# Patient Record
Sex: Female | Born: 1955 | Race: White | Hispanic: No | Smoking: Current every day smoker
Health system: Southern US, Community
[De-identification: ages and names within clinical notes are randomized; demographics above are authoritative.]

## PROBLEM LIST (undated history)

## (undated) DIAGNOSIS — F419 Anxiety disorder, unspecified: Secondary | ICD-10-CM

## (undated) DIAGNOSIS — I509 Heart failure, unspecified: Secondary | ICD-10-CM

## (undated) DIAGNOSIS — M797 Fibromyalgia: Secondary | ICD-10-CM

## (undated) DIAGNOSIS — F329 Major depressive disorder, single episode, unspecified: Secondary | ICD-10-CM

## (undated) DIAGNOSIS — F32A Depression, unspecified: Secondary | ICD-10-CM

## (undated) DIAGNOSIS — M5030 Other cervical disc degeneration, unspecified cervical region: Secondary | ICD-10-CM

## (undated) HISTORY — DX: Major depressive disorder, single episode, unspecified: F32.9

## (undated) HISTORY — DX: Other cervical disc degeneration, unspecified cervical region: M50.30

## (undated) HISTORY — DX: Heart failure, unspecified: I50.9

## (undated) HISTORY — DX: Depression, unspecified: F32.A

## (undated) HISTORY — DX: Anxiety disorder, unspecified: F41.9

---

## 2001-10-07 HISTORY — PX: OTHER SURGICAL HISTORY: SHX169

## 2006-03-05 ENCOUNTER — Ambulatory Visit: Payer: Self-pay | Admitting: Pain Medicine

## 2006-03-11 ENCOUNTER — Ambulatory Visit: Payer: Self-pay | Admitting: Pain Medicine

## 2006-03-30 ENCOUNTER — Ambulatory Visit: Payer: Self-pay | Admitting: Pain Medicine

## 2006-04-16 ENCOUNTER — Ambulatory Visit: Payer: Self-pay | Admitting: Pain Medicine

## 2011-03-20 ENCOUNTER — Ambulatory Visit: Payer: Self-pay | Admitting: Pain Medicine

## 2014-03-10 LAB — LIPID PANEL
Cholesterol: 179 mg/dL (ref 0–200)
HDL: 73 mg/dL — AB (ref 35–70)
LDL CALC: 92 mg/dL
Triglycerides: 74 mg/dL (ref 40–160)

## 2014-03-10 LAB — BASIC METABOLIC PANEL: Glucose: 88 mg/dL

## 2014-05-02 ENCOUNTER — Other Ambulatory Visit: Payer: Self-pay

## 2014-05-02 LAB — COMPREHENSIVE METABOLIC PANEL
AST: 16 U/L (ref 15–37)
Albumin: 3.5 g/dL (ref 3.4–5.0)
Alkaline Phosphatase: 94 U/L
Anion Gap: 4 — ABNORMAL LOW (ref 7–16)
BUN: 16 mg/dL (ref 7–18)
Bilirubin,Total: 0.4 mg/dL (ref 0.2–1.0)
CHLORIDE: 106 mmol/L (ref 98–107)
CREATININE: 0.82 mg/dL (ref 0.60–1.30)
Calcium, Total: 8.9 mg/dL (ref 8.5–10.1)
Co2: 32 mmol/L (ref 21–32)
EGFR (Non-African Amer.): >60
Glucose: 76 mg/dL (ref 65–99)
OSMOLALITY: 283 (ref 275–301)
Potassium: 3.7 mmol/L (ref 3.5–5.1)
SGPT (ALT): 18 U/L (ref 12–78)
Sodium: 142 mmol/L (ref 136–145)
Total Protein: 7 g/dL (ref 6.4–8.2)

## 2015-10-29 ENCOUNTER — Encounter: Payer: Self-pay | Admitting: Physician Assistant

## 2015-10-29 ENCOUNTER — Ambulatory Visit (INDEPENDENT_AMBULATORY_CARE_PROVIDER_SITE_OTHER): Payer: BLUE CROSS/BLUE SHIELD | Admitting: Physician Assistant

## 2015-10-29 VITALS — BP 130/80 | HR 86 | Temp 97.9°F | Resp 16 | Wt 190.2 lb

## 2015-10-29 DIAGNOSIS — F419 Anxiety disorder, unspecified: Secondary | ICD-10-CM | POA: Insufficient documentation

## 2015-10-29 DIAGNOSIS — Z6372 Alcoholism and drug addiction in family: Secondary | ICD-10-CM | POA: Insufficient documentation

## 2015-10-29 DIAGNOSIS — M797 Fibromyalgia: Secondary | ICD-10-CM | POA: Insufficient documentation

## 2015-10-29 DIAGNOSIS — F32A Depression, unspecified: Secondary | ICD-10-CM | POA: Insufficient documentation

## 2015-10-29 DIAGNOSIS — J40 Bronchitis, not specified as acute or chronic: Secondary | ICD-10-CM | POA: Diagnosis not present

## 2015-10-29 DIAGNOSIS — R634 Abnormal weight loss: Secondary | ICD-10-CM | POA: Insufficient documentation

## 2015-10-29 DIAGNOSIS — Z791 Long term (current) use of non-steroidal anti-inflammatories (NSAID): Secondary | ICD-10-CM | POA: Insufficient documentation

## 2015-10-29 DIAGNOSIS — R059 Cough, unspecified: Secondary | ICD-10-CM

## 2015-10-29 DIAGNOSIS — F329 Major depressive disorder, single episode, unspecified: Secondary | ICD-10-CM | POA: Insufficient documentation

## 2015-10-29 DIAGNOSIS — M419 Scoliosis, unspecified: Secondary | ICD-10-CM | POA: Insufficient documentation

## 2015-10-29 DIAGNOSIS — Z8719 Personal history of other diseases of the digestive system: Secondary | ICD-10-CM | POA: Insufficient documentation

## 2015-10-29 DIAGNOSIS — M5030 Other cervical disc degeneration, unspecified cervical region: Secondary | ICD-10-CM | POA: Insufficient documentation

## 2015-10-29 DIAGNOSIS — R05 Cough: Secondary | ICD-10-CM | POA: Diagnosis not present

## 2015-10-29 MED ORDER — PREDNISONE 10 MG (21) PO TBPK
ORAL_TABLET | ORAL | Status: DC
Start: 2015-10-29 — End: 2015-11-05

## 2015-10-29 MED ORDER — AZITHROMYCIN 250 MG PO TABS: ORAL_TABLET | ORAL | Status: DC

## 2015-10-29 NOTE — Progress Notes (Signed)
Patient: Maureen Grant Female    DOB: Aug 11, 1956   59 y.o.   MRN: [id number] Visit Date: 10/29/2015  Today's Provider: Margaretann Loveless, PA-C   Chief Complaint  Patient presents with  . URI   Subjective:    URI  This is a new problem. The current episode started 1 to 4 weeks ago. The problem has been gradually worsening. There has been no fever. Associated symptoms include coughing, nausea (none currently) and wheezing (only when lying). Pertinent negatives include no abdominal pain, chest pain, congestion, headaches, rhinorrhea, sinus pain, sneezing or vomiting. Associated symptoms comments: Just the chest tightness and congestion. She has tried nothing for the symptoms. The treatment provided no relief.  She states her symptoms started Saturday, November 19. She went to [facility] with some friends and noticed that she was walking around she was coughing and having difficulty breathing. On Sunday the 20th she developed nausea along with the cough that lasted through Monday. She did not go to work these 2 days. She states that she coughs with deep breathing and developed shortness of breath with any exertion even when speaking. She has difficulty lying flat stating that she can't breathe well when she lies directly on her back. She can lie on her side but does notice some wheezing. She does not notice any wheezing when she is standing or sitting. She denies any sneezing, rhinorrhea, fever or chills. She does have positive history of smoking for the last 42 years. She states she tried to quit in 2015 where she switched to a E-cigarettes. She states that she does keep a pack of her tenuous limbs with her at work so that she can take a break. She states that she smokes one pack of cigarettes every 2 weeks. She states she has not smoked any since developing the cough and shortness of breath.     No Known Allergies Previous Medications   MELOXICAM (MOBIC) 15 MG TABLET    Take by mouth.     METHOCARBAMOL (ROBAXIN) 750 MG TABLET    Take by mouth.   NAPROXEN SODIUM (ALEVE) 220 MG CAPS    Take by mouth.   VENLAFAXINE XR (EFFEXOR-XR) 150 MG 24 HR CAPSULE    Take 150 mg by mouth daily with breakfast.    Review of Systems  Constitutional: Negative for fever, chills and fatigue.  HENT: Negative for congestion, rhinorrhea, sinus pressure and sneezing.   Eyes: Negative.   Respiratory: Positive for cough, chest tightness, shortness of breath and wheezing (only when lying).   Cardiovascular: Negative for chest pain, palpitations and leg swelling.  Gastrointestinal: Positive for nausea (none currently). Negative for vomiting, abdominal pain and abdominal distention.  Genitourinary: Negative.   Musculoskeletal: Negative.   Neurological: Negative for dizziness, light-headedness and headaches.    Social History  Substance Use Topics  . Smoking status: Current Some Day Smoker  . Smokeless tobacco: Not on file  . Alcohol Use: No   Objective:   BP 130/80 mmHg  Pulse 86  Temp(Src) 97.9 F (36.6 C) (Oral)  Resp 16  Wt 190 lb 3.2 oz (86.274 kg)  SpO2 97%  Physical Exam  Constitutional: She appears well-developed and well-nourished. No distress.  HENT:  Head: Normocephalic and atraumatic.  Right Ear: Hearing, tympanic membrane, external ear and ear canal normal.  Left Ear: Hearing, tympanic membrane, external ear and ear canal normal.  Nose: Nose normal. Right sinus exhibits no maxillary sinus tenderness and no  frontal sinus tenderness. Left sinus exhibits no maxillary sinus tenderness and no frontal sinus tenderness.  Mouth/Throat: Uvula is midline, oropharynx is clear and moist and mucous membranes are normal. No oropharyngeal exudate.  Eyes: Conjunctivae are normal. Pupils are equal, round, and reactive to light. Right eye exhibits no discharge. Left eye exhibits no discharge. No scleral icterus.  Neck: Normal range of motion. Neck supple. No tracheal deviation present. No  thyromegaly present.  Cardiovascular: Normal rate, regular rhythm and normal heart sounds.  Exam reveals no gallop and no friction rub.   No murmur heard. Pulmonary/Chest: Effort normal. No accessory muscle usage or stridor. No respiratory distress. She has decreased breath sounds. She has no wheezes. She has no rhonchi. She has no rales.  Lymphadenopathy:    She has no cervical adenopathy.  Skin: Skin is warm and dry. She is not diaphoretic.  Vitals reviewed.       Assessment & Plan:     1. Bronchitis Worsening. There was no wheezing on exam today but she did have decreased breath sounds and cough with deep breathing. I will prescribe azithromycin as below and prednisone steroid taper. She is to call the office if symptoms fail to improve or worsen. May consider chest x-ray and spirometry due to her smoking history at that time if there is no improvement. - azithromycin (ZITHROMAX) 250 MG tablet; Take 2 tablets PO on day one, and one tablet PO daily thereafter until completed.  Dispense: 6 tablet; Refill: 0 - predniSONE (STERAPRED UNI-PAK 21 TAB) 10 MG (21) TBPK tablet; Take as directed on the package.  Dispense: 21 tablet; Refill: 0  2. Cough States her cough does not bother her that much at night if she is able to lie still and quiet. She does not wish to have a also present at this time. I did advise that she may try Delsym cough syrup or Robitussin for cough suppression if needed. She voiced understanding. Give a prednisone six-day taper as below for inflammation. She is to call the office if symptoms fail to improve or worsen. If there is no improvement may consider chest x-ray and possible spirometry to rule out other cause due to her smoking history. - predniSONE (STERAPRED UNI-PAK 21 TAB) 10 MG (21) TBPK tablet; Take as directed on the package.  Dispense: 21 tablet; Refill: 0       Margaretann LovelessJennifer M Ailyne Pawley, PA-C  [facility]

## 2015-10-29 NOTE — Patient Instructions (Signed)

## 2015-11-03 ENCOUNTER — Encounter: Payer: Self-pay | Admitting: Physician Assistant

## 2015-11-05 ENCOUNTER — Encounter: Payer: Self-pay | Admitting: Physician Assistant

## 2015-11-05 ENCOUNTER — Ambulatory Visit (INDEPENDENT_AMBULATORY_CARE_PROVIDER_SITE_OTHER): Payer: BLUE CROSS/BLUE SHIELD | Admitting: Physician Assistant

## 2015-11-05 VITALS — BP 124/80 | HR 86 | Temp 99.9°F | Resp 16 | Wt 193.6 lb

## 2015-11-05 DIAGNOSIS — J40 Bronchitis, not specified as acute or chronic: Secondary | ICD-10-CM

## 2015-11-05 DIAGNOSIS — R05 Cough: Secondary | ICD-10-CM

## 2015-11-05 DIAGNOSIS — R059 Cough, unspecified: Secondary | ICD-10-CM

## 2015-11-05 MED ORDER — LEVOFLOXACIN 500 MG PO TABS: 500.0000 mg | ORAL_TABLET | Freq: Every day | ORAL | Status: DC

## 2015-11-05 MED ORDER — PREDNISONE 10 MG (21) PO TBPK: ORAL_TABLET | ORAL | Status: DC

## 2015-11-05 NOTE — Patient Instructions (Signed)

## 2015-11-05 NOTE — Progress Notes (Signed)
Patient: Maureen Grant Female    DOB: 09-14-1956   59 y.o.   MRN: [id number] Visit Date: 11/05/2015  Today's Provider: Margaretann Loveless, PA-C   Chief Complaint  Patient presents with  . URI   Subjective:    URI  This is a recurrent problem. The current episode started 1 to 4 weeks ago. Associated symptoms include congestion (chest), coughing (still the same) and wheezing. Pertinent negatives include no ear pain, sneezing or sore throat. She has tried decongestant (was taking azithromycin) for the symptoms.   she was prescribed azithromycin and a prednisone 6 day taper. She states she has not had much improvement with the treatment. She states she continues to have increasing cough and wheezing when she lies down flat. She states she has also had some increased shortness of breath with exertion and fatigue. She has not measured her temperature to see if she has had a fever. Her cough is been mostly dry with occasional mucus production.     No Known Allergies Previous Medications   AZITHROMYCIN (ZITHROMAX) 250 MG TABLET    Take 2 tablets PO on day one, and one tablet PO daily thereafter until completed.   MELOXICAM (MOBIC) 15 MG TABLET    Take by mouth.   METHOCARBAMOL (ROBAXIN) 750 MG TABLET    Take by mouth.   NAPROXEN SODIUM (ALEVE) 220 MG CAPS    Take by mouth.   PREDNISONE (STERAPRED UNI-PAK 21 TAB) 10 MG (21) TBPK TABLET    Take as directed on the package.   VENLAFAXINE XR (EFFEXOR-XR) 150 MG 24 HR CAPSULE    Take 150 mg by mouth daily with breakfast.    Review of Systems  Constitutional: Positive for fatigue. Negative for fever and chills.  HENT: Positive for congestion (chest), postnasal drip and sinus pressure. Negative for ear pain, hearing loss, nosebleeds, sneezing, sore throat and trouble swallowing.   Eyes: Negative.   Respiratory: Positive for cough (still the same), chest tightness, shortness of breath and wheezing.   Cardiovascular: Negative.     Gastrointestinal: Negative.     Social History  Substance Use Topics  . Smoking status: Current Some Day Smoker  . Smokeless tobacco: Not on file  . Alcohol Use: No   Objective:   BP 124/80 mmHg  Pulse 86  Temp(Src) 99.9 F (37.7 C) (Oral)  Resp 16  Wt 193 lb 9.6 oz (87.816 kg)  Physical Exam  Constitutional: She appears well-developed and well-nourished. No distress.  HENT:  Head: Normocephalic and atraumatic.  Right Ear: Hearing, tympanic membrane, external ear and ear canal normal.  Left Ear: Hearing, tympanic membrane, external ear and ear canal normal.  Nose: Nose normal.  Mouth/Throat: Uvula is midline, oropharynx is clear and moist and mucous membranes are normal. No oropharyngeal exudate.  Eyes: Conjunctivae are normal. Pupils are equal, round, and reactive to light. Right eye exhibits no discharge. Left eye exhibits no discharge. No scleral icterus.  Neck: Normal range of motion. Neck supple. No tracheal deviation present. No thyromegaly present.  Cardiovascular: Normal rate, regular rhythm and normal heart sounds.  Exam reveals no gallop and no friction rub.   No murmur heard. Pulmonary/Chest: Effort normal. No stridor. No respiratory distress. She has no wheezes. She has rhonchi in the left upper field. She has no rales.  Lymphadenopathy:    She has no cervical adenopathy.  Skin: Skin is warm and dry. She is not diaphoretic.  Vitals reviewed.  Assessment & Plan:     1. Bronchitis Not improving. We'll change antibiotic from azithromycin to Levaquin as below. I have also given another round of prednisone 6 day taper. I did advise that she may need to proper self up at night instead of lying flat to help with breathing. She states that over-the-counter cough suppressants are still working to allow her to sleep at night with her cough. She is to call the office if symptoms fail to improve or worsen as we may obtain a chest x-ray at that time. I will see her back  as needed or if symptoms do not improve. - levofloxacin (LEVAQUIN) 500 MG tablet; Take 1 tablet (500 mg total) by mouth daily.  Dispense: 10 tablet; Refill: 0 - predniSONE (STERAPRED UNI-PAK 21 TAB) 10 MG (21) TBPK tablet; Take as directed on the package.  Dispense: 21 tablet; Refill: 0  2. Cough See above medical treatment plan. - predniSONE (STERAPRED UNI-PAK 21 TAB) 10 MG (21) TBPK tablet; Take as directed on the package.  Dispense: 21 tablet; Refill: 0       Margaretann LovelessJennifer M Burnette, PA-C  [facility]

## 2015-11-23 ENCOUNTER — Telehealth: Payer: Self-pay

## 2015-11-23 DIAGNOSIS — J069 Acute upper respiratory infection, unspecified: Secondary | ICD-10-CM

## 2015-11-23 MED ORDER — AMOXICILLIN-POT CLAVULANATE 875-125 MG PO TABS: 1.0000 | ORAL_TABLET | Freq: Two times a day (BID) | ORAL | Status: DC

## 2015-11-23 NOTE — Telephone Encounter (Signed)
Patient reports that she was seen about 2 weeks ago and was treated for Bronchitis. She is wanting to know if she can have another round of abx. She reports that for the past 2 days, she has had a cough, wheezing, and shortness of breath. Denies fever. Patient is requesting that something be called into the pharmacy. Thanks!

## 2015-11-23 NOTE — Telephone Encounter (Signed)
Antibiotic sent to [facility]

## 2015-12-11 ENCOUNTER — Other Ambulatory Visit: Payer: Self-pay | Admitting: Specialist

## 2015-12-11 DIAGNOSIS — R0609 Other forms of dyspnea: Secondary | ICD-10-CM

## 2015-12-11 DIAGNOSIS — Z8709 Personal history of other diseases of the respiratory system: Secondary | ICD-10-CM

## 2015-12-11 DIAGNOSIS — F17200 Nicotine dependence, unspecified, uncomplicated: Secondary | ICD-10-CM

## 2015-12-18 ENCOUNTER — Encounter: Payer: Self-pay | Admitting: Physician Assistant

## 2015-12-18 ENCOUNTER — Ambulatory Visit (INDEPENDENT_AMBULATORY_CARE_PROVIDER_SITE_OTHER): Payer: BLUE CROSS/BLUE SHIELD | Admitting: Physician Assistant

## 2015-12-18 VITALS — BP 130/88 | HR 97 | Temp 98.3°F | Resp 16 | Wt 183.4 lb

## 2015-12-18 DIAGNOSIS — R0609 Other forms of dyspnea: Secondary | ICD-10-CM | POA: Diagnosis not present

## 2015-12-18 DIAGNOSIS — R197 Diarrhea, unspecified: Secondary | ICD-10-CM

## 2015-12-18 DIAGNOSIS — R059 Cough, unspecified: Secondary | ICD-10-CM

## 2015-12-18 DIAGNOSIS — J984 Other disorders of lung: Secondary | ICD-10-CM

## 2015-12-18 DIAGNOSIS — R221 Localized swelling, mass and lump, neck: Secondary | ICD-10-CM | POA: Diagnosis not present

## 2015-12-18 DIAGNOSIS — R05 Cough: Secondary | ICD-10-CM

## 2015-12-18 MED ORDER — HYDROCODONE-HOMATROPINE 5-1.5 MG/5ML PO SYRP: 5.0000 mL | ORAL_SOLUTION | Freq: Three times a day (TID) | ORAL | Status: DC | PRN

## 2015-12-18 MED ORDER — MOMETASONE FURO-FORMOTEROL FUM 200-5 MCG/ACT IN AERO: 2.0000 | INHALATION_SPRAY | Freq: Two times a day (BID) | RESPIRATORY_TRACT | Status: DC

## 2015-12-18 NOTE — Patient Instructions (Signed)
Formoterol; Mometasone metered dose inhaler What is this medicine? FORMOTEROL; MOMETASONE (for [place] te rol; moe MET a sone) inhalation is a combination of two medicines that decrease inflammation and help to open up the airways in your lungs. It is used to treat asthma. Do NOT use in an acute asthma attack. This medicine may be used for other purposes; ask your health care provider or pharmacist if you have questions. What should I tell my health care provider before I take this medicine? They need to know if you have any of these conditions: -adrenal tumor -aneurysm -bone problems -diabetes -glaucoma -heart disease or irregular heartbeat -high blood pressure -immune system problems -infection -seizures -thyroid problems -worsening asthma -an unusual or allergic reaction to formoterol, mometasone, other medicines, foods, dyes, or preservatives -pregnant or trying to get pregnant -breast-feeding How should I use this medicine? This medicine is inhaled through the mouth. Follow the directions on the prescription label. Rinse your mouth with water after use. Make sure not to swallow the water. Take your medicine at regular intervals. Do not take your medicine more often than directed. Do not stop taking except on your doctor's advice. Make sure that you are using your inhaler correctly. Ask your doctor or health care provider if you have any questions. A special MedGuide will be given to you by the pharmacist with each prescription and refill. Be sure to read this information carefully each time. Talk to your pediatrician regarding the use of this medicine in children. Special care may be needed. Overdosage: If you think you have taken too much of this medicine contact a poison control center or emergency room at once. NOTE: This medicine is only for you. Do not share this medicine with others. What if I miss a dose? If you miss a dose, use it as soon as you remember. If it is almost time for  your next dose, use only that dose and continue with your regular schedule, spacing doses evenly. Do not use double or extra doses. What may interact with this medicine? Do not take this medicine with any of the following mediations: -MAOIs like Carbex, Eldepryl, Marplan, Nardil, and Parnate This medicine may also interact with the following medications: -aminophylline or theophylline -antiviral medicines for HIV or AIDS -certain antibiotics like clarithromycin, linezolid, and telithromycin -certain medicines for blood pressure, heart disease, or irregular heart beat -certain medicines for colds -certain medicines for depression or emotional conditions -certain medicines for fungal infections like ketoconazole and itraconazole -diuretics -other medicines for breathing problems This list may not describe all possible interactions. Give your health care provider a list of all the medicines, herbs, non-prescription drugs, or dietary supplements you use. Also tell them if you smoke, drink alcohol, or use illegal drugs. Some items may interact with your medicine. What should I watch for while using this medicine? Visit your doctor for regular check ups. Tell your doctor or health care professional if your symptoms do not get better. If your symptoms get worse or if you need your short-acting inhalers more often, call your doctor right away. Do not use this medicine more than every 12 hours. If you have asthma, be aware that using this medicine may increase your risk of dying from asthma-related problems. Talk to your doctor about the risks and benefits of taking this medicine. NEVER use this medicine for an acute asthma attack. This medicine may increase your risk of getting an infection. Tell your doctor or health care professional if you are around  anyone with measles or chickenpox, or if you develop sores or blisters that do not heal properly. What side effects may I notice from receiving this  medicine? Side effects that you should report to your doctor or health care professional as soon as possible: -allergic reactions like skin rash or hives, swelling of the face, lips, or tongue -breathing problems -chest pain -dizziness or lightheaded -fever or chills -high blood pressure -irregular heartbeat -vision problems Side effects that usually do not require medical attention (Report these to your doctor or health care professional if they continue or are bothersome.): -coughing, hoarseness, throat irritation -different taste in mouth -headache -nervousness -stomach upset -stuffy nose -tremor This list may not describe all possible side effects. Call your doctor for medical advice about side effects. You may report side effects to FDA at 1-800-FDA-1088. Where should I keep my medicine? Keep out of the reach of children. Store at room temperature between 59 and 86 degrees F (15 and 30 degrees C). Throw away the inhaler after the dose counter reaches 0 or after the expiration date, whichever comes first. Avoid exposure to heat, fire, and flame. NOTE: This sheet is a summary. It may not cover all possible information. If you have questions about this medicine, talk to your doctor, pharmacist, or health care provider.    2016, Elsevier/Gold Standard. (2013-03-24 16:04:55) Diarrhea Diarrhea is frequent loose and watery bowel movements. It can cause you to feel weak and dehydrated. Dehydration can cause you to become tired and thirsty, have a dry mouth, and have decreased urination that often is dark yellow. Diarrhea is a sign of another problem, most often an infection that will not last long. In most cases, diarrhea typically lasts 2-3 days. However, it can last longer if it is a sign of something more serious. It is important to treat your diarrhea as directed by your caregiver to lessen or prevent future episodes of diarrhea. CAUSES  Some common causes include:  Gastrointestinal  infections caused by viruses, bacteria, or parasites.  Food poisoning or food allergies.  Certain medicines, such as antibiotics, chemotherapy, and laxatives.  Artificial sweeteners and fructose.  Digestive disorders. HOME CARE INSTRUCTIONS  Ensure adequate fluid intake (hydration): Have 1 cup (8 oz) of fluid for each diarrhea episode. Avoid fluids that contain simple sugars or sports drinks, fruit juices, whole milk products, and sodas. Your urine should be clear or pale yellow if you are drinking enough fluids. Hydrate with an oral rehydration solution that you can purchase at pharmacies, retail stores, and online. You can prepare an oral rehydration solution at home by mixing the following ingredients together:   - tsp table salt.   tsp baking soda.   tsp salt substitute containing potassium chloride.  1  tablespoons sugar.  1 L (34 oz) of water.  Certain foods and beverages may increase the speed at which food moves through the gastrointestinal (GI) tract. These foods and beverages should be avoided and include:  Caffeinated and alcoholic beverages.  High-fiber foods, such as raw fruits and vegetables, nuts, seeds, and whole grain breads and cereals.  Foods and beverages sweetened with sugar alcohols, such as xylitol, sorbitol, and mannitol.  Some foods may be well tolerated and may help thicken stool including:  Starchy foods, such as rice, toast, pasta, low-sugar cereal, oatmeal, grits, baked potatoes, crackers, and bagels.  Bananas.  Applesauce.  Add probiotic-rich foods to help increase healthy bacteria in the GI tract, such as yogurt and fermented milk products.  Wash  your hands well after each diarrhea episode.  Only take over-the-counter or prescription medicines as directed by your caregiver.  Take a warm bath to relieve any burning or pain from frequent diarrhea episodes. SEEK IMMEDIATE MEDICAL CARE IF:   You are unable to keep fluids down.  You have  persistent vomiting.  You have blood in your stool, or your stools are black and tarry.  You do not urinate in 6-8 hours, or there is only a small amount of very dark urine.  You have abdominal pain that increases or localizes.  You have weakness, dizziness, confusion, or light-headedness.  You have a severe headache.  Your diarrhea gets worse or does not get better.  You have a fever or persistent symptoms for more than 2-3 days.  You have a fever and your symptoms suddenly get worse. MAKE SURE YOU:   Understand these instructions.  Will watch your condition.  Will get help right away if you are not doing well or get worse.   This information is not intended to replace advice given to you by your health care provider. Make sure you discuss any questions you have with your health care provider.   Document Released: 11/07/2002 Document Revised: 12/08/2014 Document Reviewed: 07/25/2012 Elsevier Interactive Patient Education [employer].

## 2015-12-18 NOTE — Progress Notes (Signed)
Patient: Maureen Grant Female    DOB: [birth year]   60 y.o.   MRN: [id number] Visit Date: 12/18/2015  Today's Provider: Margaretann Loveless, PA-C   Chief Complaint  Patient presents with  . Follow-up    Bronchitis   Subjective:    HPI Bronchitis:Patient is here for follow-up on her bronchitis. Last office visit the antibiotic was changed from azithromycin to Levaquin and was also given another round of prednisone 6 day taper. Per patient she is not doing any better with the cough,SOB,wheezing and chest tightness.Patient went to [facility] went because her bronchitis was not any better. They obtained an Xray and got new medication Prednisone, Albuterol Inhaler, codeine-guaifenesin which patient completed the course.Patient was Referred to Pulmonary and patient was seen on the 10th of January and had a breathing test which showed a moderate to severe restrictive pattern.  Patient also got an Echo done yesterday for which we do not have the results for, and has an appointment for CT chest scan on the 19th of Jan at the [facility]. Per patient the neck pain and swelling is new, and has had vomiting and diarrhea for a week now. No melena or hematochezia. Loose, watery stools occurring approximately 2-3 times daily. Denies any tenesmus or abdominal pain/cramping.   Allergies  Allergen Reactions  . Latex Dermatitis   Previous Medications   MELOXICAM (MOBIC) 15 MG TABLET    Take by mouth.   METHOCARBAMOL (ROBAXIN) 750 MG TABLET    Take by mouth.   NAPROXEN SODIUM (ALEVE) 220 MG CAPS    Take by mouth.   PROVENTIL HFA 108 (90 BASE) MCG/ACT INHALER    INHALE 2 PUFFS PO Q 6 H PRN FOR WHEEZING   VENLAFAXINE XR (EFFEXOR-XR) 150 MG 24 HR CAPSULE    Take 150 mg by mouth daily with breakfast.    Review of Systems  Constitutional: Positive for chills and fatigue.  HENT: Positive for congestion and rhinorrhea. Negative for ear pain, sneezing, sore throat, tinnitus, trouble  swallowing and voice change.   Eyes: Negative.   Respiratory: Positive for cough, chest tightness, shortness of breath and wheezing.   Cardiovascular: Negative for chest pain, palpitations and leg swelling.  Gastrointestinal: Positive for vomiting (it has been a week) and diarrhea (it started a week ago). Negative for nausea, abdominal pain and constipation.  Endocrine: Negative.   Genitourinary: Negative.   Musculoskeletal: Positive for neck pain (right side hurts ans swollen).  Skin: Negative.   Allergic/Immunologic: Negative.   Neurological: Negative.   Hematological: Negative.   Psychiatric/Behavioral: Negative.   All other systems reviewed and are negative.   Social History  Substance Use Topics  . Smoking status: Current Some Day Smoker  . Smokeless tobacco: Not on file  . Alcohol Use: No   Objective:   BP 130/88 mmHg  Pulse 97  Temp(Src) 98.3 F (36.8 C) (Oral)  Resp 16  Wt 183 lb 6.4 oz (83.19 kg)  SpO2 96%  Physical Exam  Constitutional: She appears well-developed and well-nourished. No distress.  HENT:  Head: Normocephalic and atraumatic.  Right Ear: Hearing, tympanic membrane, external ear and ear canal normal.  Left Ear: Hearing, tympanic membrane, external ear and ear canal normal.  Nose: Nose normal. Right sinus exhibits no maxillary sinus tenderness and no frontal sinus tenderness. Left sinus exhibits no maxillary sinus tenderness and no frontal sinus tenderness.  Mouth/Throat: Uvula is midline, oropharynx is clear and moist and mucous membranes  are normal. No oropharyngeal exudate, posterior oropharyngeal edema or posterior oropharyngeal erythema.  Eyes: Conjunctivae are normal. Pupils are equal, round, and reactive to light. Right eye exhibits no discharge. Left eye exhibits no discharge. No scleral icterus.  Neck: Normal range of motion. Neck supple. No JVD present. Carotid bruit is not present. No rigidity. No tracheal deviation, no erythema and normal range  of motion present.    Cardiovascular: Normal rate, regular rhythm and normal heart sounds.  Exam reveals no gallop and no friction rub.   No murmur heard. Pulmonary/Chest: Effort normal and breath sounds normal. No stridor. No respiratory distress. She has no wheezes. She has no rales.  Abdominal: Soft. Bowel sounds are normal. She exhibits no distension and no mass. There is no tenderness. There is no rebound and no guarding.  Musculoskeletal: Normal range of motion. She exhibits no edema.  Lymphadenopathy:    She has no cervical adenopathy.  Skin: Skin is warm and dry. She is not diaphoretic.  Vitals reviewed.       Assessment & Plan:     1. Cough I will refill her Hycodan cough syrup for her nighttime cough. Cough had been managed with this medication. She is to follow-up with a chest CT on January 19 and then see Dr. Meredeth Ide, pulmonology, on January 23 to follow-up all of her testing. I will see her back in 2 weeks following her appointment with Dr. Meredeth Ide to further reevaluate how she is doing and to follow-up with the diarrhea. She is to call the office in the meantime if she has any worsening symptoms, questions or concerns. - HYDROcodone-homatropine (HYCODAN) 5-1.5 MG/5ML syrup; Take 5 mLs by mouth every 8 (eight) hours as needed for cough.  Dispense: 120 mL; Refill: 0  2. Restrictive airway disease She states that she has been using her albuterol inhaler approximately 3-4 times daily for wheezing and shortness of breath. I will also give her a Dulera inhaler as a sample today in the office. She is to use this inhaler 2 puffs twice daily. Per Dr. Reita Cliche note it does seem that she does have a moderately severe restrictive pattern on her pulmonary function test as well as possible pleural effusion that was noted on chest x-ray. She also was noted to have cardiomegaly on her chest x-ray and underwent echocardiogram. I have not seen the results of the echocardiogram at this time. We  will try to obtain these as well. She is also going to be having a chest CT to further evaluate these abnormalities seen on other imaging. She does follow-up with Dr. Meredeth Ide on 12/24/2015. I will see her back in approximately 2 weeks so that we can discuss what her and Dr. Reita Cliche plan is. She is to call the office in the meantime if she has any worsening symptoms, questions or concerns.  3. DOE (dyspnea on exertion) Shortness of breath and cough are worse when she exerts herself. She denies any other signs of possible heart failure including pedal edema, orthopnea, PND, chest pain. She did have an echocardiogram that we are waiting results. I will follow-up with her in 2 weeks.  4. Neck mass She does have a new onset visible, palpable and tender mass on the right side of her neck. She states this has come up over the last week. It is possible that this could be a reactive lymph node but with her other issues that are going on I do feel that it would be best to get an  ultrasound to better evaluate this mass. I will follow-up with her pending these results. She is to call the office if this area becomes larger, more tender or causes other issues such as difficulty swallowing or causes difficulty breathing. - US Soft Tissue Head/Neck; Future  5. Diarrhea, unspecified type I do question if she has a mild gastritis secondary to the steroids and antibiotics she has been on recently for possible bronchitis. I do not feel that it is a C. difficile infection as the bowel movements are not frequent nor does she have abdominal pain or cramping. I did advise her that she may benefit from taking a probiotic and Imodium over-the-counter for treatment. I also advised her to make sure to stay well-hydrated so that she does not become dehydrated from the diarrhea. I will follow-up with her in 2 weeks to see if there is any improvement. She is to call the office if her diarrhea and/or vomiting worsen in the  meantime.       Margaretann Loveless, PA-C  [facility]

## 2015-12-20 ENCOUNTER — Ambulatory Visit
Admission: RE | Admit: 2015-12-20 | Discharge: 2015-12-20 | Disposition: A | Discharge status: Home / Self Care | Payer: BLUE CROSS/BLUE SHIELD | Source: Ambulatory Visit | Attending: Specialist | Admitting: Specialist

## 2015-12-20 DIAGNOSIS — R0609 Other forms of dyspnea: Secondary | ICD-10-CM | POA: Diagnosis not present

## 2015-12-20 DIAGNOSIS — R05 Cough: Secondary | ICD-10-CM | POA: Diagnosis not present

## 2015-12-20 DIAGNOSIS — F17200 Nicotine dependence, unspecified, uncomplicated: Secondary | ICD-10-CM | POA: Diagnosis not present

## 2015-12-20 DIAGNOSIS — I517 Cardiomegaly: Secondary | ICD-10-CM | POA: Diagnosis not present

## 2015-12-20 DIAGNOSIS — Z8709 Personal history of other diseases of the respiratory system: Secondary | ICD-10-CM | POA: Insufficient documentation

## 2015-12-20 DIAGNOSIS — J90 Pleural effusion, not elsewhere classified: Secondary | ICD-10-CM | POA: Diagnosis not present

## 2015-12-21 ENCOUNTER — Ambulatory Visit: Payer: BLUE CROSS/BLUE SHIELD

## 2015-12-27 ENCOUNTER — Emergency Department: Payer: BLUE CROSS/BLUE SHIELD

## 2015-12-27 ENCOUNTER — Encounter: Payer: Self-pay | Admitting: Emergency Medicine

## 2015-12-27 ENCOUNTER — Inpatient Hospital Stay
Admission: EM | Admit: 2015-12-27 | Discharge: 2016-01-02 | DRG: 286 | Disposition: A | Discharge status: Home / Self Care | Payer: BLUE CROSS/BLUE SHIELD | Attending: Internal Medicine | Admitting: Internal Medicine

## 2015-12-27 DIAGNOSIS — I2510 Atherosclerotic heart disease of native coronary artery without angina pectoris: Secondary | ICD-10-CM | POA: Diagnosis present

## 2015-12-27 DIAGNOSIS — E876 Hypokalemia: Secondary | ICD-10-CM | POA: Diagnosis present

## 2015-12-27 DIAGNOSIS — J189 Pneumonia, unspecified organism: Secondary | ICD-10-CM | POA: Diagnosis present

## 2015-12-27 DIAGNOSIS — Z833 Family history of diabetes mellitus: Secondary | ICD-10-CM | POA: Diagnosis not present

## 2015-12-27 DIAGNOSIS — I82611 Acute embolism and thrombosis of superficial veins of right upper extremity: Secondary | ICD-10-CM | POA: Diagnosis present

## 2015-12-27 DIAGNOSIS — R0600 Dyspnea, unspecified: Secondary | ICD-10-CM | POA: Diagnosis present

## 2015-12-27 DIAGNOSIS — Z818 Family history of other mental and behavioral disorders: Secondary | ICD-10-CM | POA: Diagnosis not present

## 2015-12-27 DIAGNOSIS — Z87891 Personal history of nicotine dependence: Secondary | ICD-10-CM | POA: Diagnosis not present

## 2015-12-27 DIAGNOSIS — I959 Hypotension, unspecified: Secondary | ICD-10-CM | POA: Diagnosis present

## 2015-12-27 DIAGNOSIS — I420 Dilated cardiomyopathy: Secondary | ICD-10-CM | POA: Diagnosis present

## 2015-12-27 DIAGNOSIS — Y95 Nosocomial condition: Secondary | ICD-10-CM | POA: Diagnosis present

## 2015-12-27 DIAGNOSIS — Z809 Family history of malignant neoplasm, unspecified: Secondary | ICD-10-CM

## 2015-12-27 DIAGNOSIS — Z8261 Family history of arthritis: Secondary | ICD-10-CM

## 2015-12-27 DIAGNOSIS — Z832 Family history of diseases of the blood and blood-forming organs and certain disorders involving the immune mechanism: Secondary | ICD-10-CM

## 2015-12-27 DIAGNOSIS — D6859 Other primary thrombophilia: Secondary | ICD-10-CM | POA: Diagnosis present

## 2015-12-27 DIAGNOSIS — Z79899 Other long term (current) drug therapy: Secondary | ICD-10-CM | POA: Diagnosis not present

## 2015-12-27 DIAGNOSIS — M797 Fibromyalgia: Secondary | ICD-10-CM | POA: Diagnosis present

## 2015-12-27 DIAGNOSIS — R58 Hemorrhage, not elsewhere classified: Secondary | ICD-10-CM

## 2015-12-27 DIAGNOSIS — I82C11 Acute embolism and thrombosis of right internal jugular vein: Principal | ICD-10-CM | POA: Diagnosis present

## 2015-12-27 DIAGNOSIS — I809 Phlebitis and thrombophlebitis of unspecified site: Secondary | ICD-10-CM | POA: Diagnosis present

## 2015-12-27 DIAGNOSIS — R778 Other specified abnormalities of plasma proteins: Secondary | ICD-10-CM

## 2015-12-27 DIAGNOSIS — I5033 Acute on chronic diastolic (congestive) heart failure: Secondary | ICD-10-CM | POA: Diagnosis present

## 2015-12-27 DIAGNOSIS — I82401 Acute embolism and thrombosis of unspecified deep veins of right lower extremity: Secondary | ICD-10-CM

## 2015-12-27 DIAGNOSIS — R079 Chest pain, unspecified: Secondary | ICD-10-CM

## 2015-12-27 DIAGNOSIS — I82290 Acute embolism and thrombosis of other thoracic veins: Secondary | ICD-10-CM | POA: Diagnosis present

## 2015-12-27 DIAGNOSIS — Z91040 Latex allergy status: Secondary | ICD-10-CM

## 2015-12-27 DIAGNOSIS — I209 Angina pectoris, unspecified: Secondary | ICD-10-CM

## 2015-12-27 DIAGNOSIS — I509 Heart failure, unspecified: Secondary | ICD-10-CM

## 2015-12-27 DIAGNOSIS — I82B19 Acute embolism and thrombosis of unspecified subclavian vein: Secondary | ICD-10-CM | POA: Diagnosis present

## 2015-12-27 DIAGNOSIS — R748 Abnormal levels of other serum enzymes: Secondary | ICD-10-CM | POA: Diagnosis present

## 2015-12-27 DIAGNOSIS — R7989 Other specified abnormal findings of blood chemistry: Secondary | ICD-10-CM

## 2015-12-27 DIAGNOSIS — Z8249 Family history of ischemic heart disease and other diseases of the circulatory system: Secondary | ICD-10-CM

## 2015-12-27 DIAGNOSIS — I2699 Other pulmonary embolism without acute cor pulmonale: Secondary | ICD-10-CM | POA: Diagnosis not present

## 2015-12-27 DIAGNOSIS — F329 Major depressive disorder, single episode, unspecified: Secondary | ICD-10-CM | POA: Diagnosis not present

## 2015-12-27 DIAGNOSIS — Z801 Family history of malignant neoplasm of trachea, bronchus and lung: Secondary | ICD-10-CM

## 2015-12-27 DIAGNOSIS — N289 Disorder of kidney and ureter, unspecified: Secondary | ICD-10-CM | POA: Diagnosis present

## 2015-12-27 DIAGNOSIS — I5023 Acute on chronic systolic (congestive) heart failure: Secondary | ICD-10-CM

## 2015-12-27 DIAGNOSIS — G8918 Other acute postprocedural pain: Secondary | ICD-10-CM

## 2015-12-27 DIAGNOSIS — R0789 Other chest pain: Secondary | ICD-10-CM

## 2015-12-27 DIAGNOSIS — M5030 Other cervical disc degeneration, unspecified cervical region: Secondary | ICD-10-CM | POA: Diagnosis not present

## 2015-12-27 DIAGNOSIS — J90 Pleural effusion, not elsewhere classified: Secondary | ICD-10-CM

## 2015-12-27 DIAGNOSIS — Z9289 Personal history of other medical treatment: Secondary | ICD-10-CM

## 2015-12-27 HISTORY — DX: Fibromyalgia: M79.7

## 2015-12-27 LAB — TROPONIN I
Troponin I: 0.14 ng/mL — ABNORMAL HIGH (ref <0.031)
Troponin I: 0.15 ng/mL — ABNORMAL HIGH (ref <0.031)

## 2015-12-27 LAB — BASIC METABOLIC PANEL
Anion gap: 12 (ref 5–15)
BUN: 14 mg/dL (ref 6–20)
CHLORIDE: 99 mmol/L — AB (ref 101–111)
CO2: 26 mmol/L (ref 22–32)
CREATININE: 1.05 mg/dL — AB (ref 0.44–1.00)
Calcium: 8.7 mg/dL — ABNORMAL LOW (ref 8.9–10.3)
GFR calc Af Amer: >60 mL/min (ref >60)
GFR calc non Af Amer: 57 mL/min — ABNORMAL LOW (ref >60)
GLUCOSE: 133 mg/dL — AB (ref 65–99)
POTASSIUM: 3.2 mmol/L — AB (ref 3.5–5.1)
SODIUM: 137 mmol/L (ref 135–145)

## 2015-12-27 LAB — TSH: TSH: 2.948 u[IU]/mL (ref 0.350–4.500)

## 2015-12-27 LAB — CBC WITH DIFFERENTIAL/PLATELET
Basophils Absolute: 0 10*3/uL (ref 0–0.1)
Basophils Relative: 0 %
EOS ABS: 0 10*3/uL (ref 0–0.7)
Eosinophils Relative: 0 %
HCT: 40.5 % (ref 35.0–47.0)
HEMOGLOBIN: 12.8 g/dL (ref 12.0–16.0)
LYMPHS ABS: 1.4 10*3/uL (ref 1.0–3.6)
LYMPHS PCT: 11 %
MCH: 27.6 pg (ref 26.0–34.0)
MCHC: 31.7 g/dL — AB (ref 32.0–36.0)
MCV: 86.9 fL (ref 80.0–100.0)
MONOS PCT: 9 %
Monocytes Absolute: 1.1 10*3/uL — ABNORMAL HIGH (ref 0.2–0.9)
NEUTROS PCT: 80 %
Neutro Abs: 10.6 10*3/uL — ABNORMAL HIGH (ref 1.4–6.5)
Platelets: 372 10*3/uL (ref 150–440)
RBC: 4.66 MIL/uL (ref 3.80–5.20)
RDW: 15.3 % — ABNORMAL HIGH (ref 11.5–14.5)
WBC: 13.2 10*3/uL — ABNORMAL HIGH (ref 3.6–11.0)

## 2015-12-27 LAB — BRAIN NATRIURETIC PEPTIDE: B NATRIURETIC PEPTIDE 5: 1185 pg/mL — AB (ref 0.0–100.0)

## 2015-12-27 MED ORDER — HYDROCODONE-HOMATROPINE 5-1.5 MG/5ML PO SYRP: 5.0000 mL | ORAL_SOLUTION | Freq: Three times a day (TID) | ORAL | Status: DC | PRN

## 2015-12-27 MED ORDER — ALBUTEROL SULFATE (2.5 MG/3ML) 0.083% IN NEBU: 2.5000 mg | INHALATION_SOLUTION | RESPIRATORY_TRACT | Status: DC | PRN

## 2015-12-27 MED ORDER — FUROSEMIDE 10 MG/ML IJ SOLN
40.0000 mg | Freq: Two times a day (BID) | INTRAMUSCULAR | Status: DC
  Administered 2015-12-27 – 2016-01-02 (×10): 40 mg via INTRAVENOUS
  Filled 2015-12-27 (×10): qty 4

## 2015-12-27 MED ORDER — ASPIRIN 81 MG PO CHEW
324.0000 mg | CHEWABLE_TABLET | Freq: Once | ORAL | Status: AC
  Administered 2015-12-27: 324 mg via ORAL

## 2015-12-27 MED ORDER — SODIUM CHLORIDE 0.9% FLUSH
3.0000 mL | Freq: Two times a day (BID) | INTRAVENOUS | Status: DC
  Administered 2015-12-30 – 2015-12-31 (×3): 3 mL via INTRAVENOUS

## 2015-12-27 MED ORDER — SODIUM CHLORIDE 0.9% FLUSH: 3.0000 mL | INTRAVENOUS | Status: DC | PRN

## 2015-12-27 MED ORDER — ACETAMINOPHEN 650 MG RE SUPP: 650.0000 mg | Freq: Four times a day (QID) | RECTAL | Status: DC | PRN

## 2015-12-27 MED ORDER — MELOXICAM 7.5 MG PO TABS
15.0000 mg | ORAL_TABLET | Freq: Every day | ORAL | Status: DC
  Administered 2015-12-27 – 2015-12-28 (×2): 15 mg via ORAL
  Filled 2015-12-27 (×2): qty 2

## 2015-12-27 MED ORDER — POTASSIUM CHLORIDE CRYS ER 20 MEQ PO TBCR
40.0000 meq | EXTENDED_RELEASE_TABLET | Freq: Every day | ORAL | Status: DC
  Administered 2015-12-27 – 2016-01-02 (×7): 40 meq via ORAL
  Filled 2015-12-27 (×8): qty 2

## 2015-12-27 MED ORDER — VENLAFAXINE HCL ER 75 MG PO CP24
150.0000 mg | ORAL_CAPSULE | Freq: Every day | ORAL | Status: DC
  Administered 2015-12-28 – 2016-01-02 (×5): 150 mg via ORAL
  Filled 2015-12-27 (×6): qty 2

## 2015-12-27 MED ORDER — FUROSEMIDE 40 MG PO TABS
ORAL_TABLET | ORAL | Status: AC
  Administered 2015-12-27: 20 mg via ORAL
  Filled 2015-12-27: qty 1

## 2015-12-27 MED ORDER — FUROSEMIDE 40 MG PO TABS
20.0000 mg | ORAL_TABLET | Freq: Once | ORAL | Status: AC
  Administered 2015-12-27: 20 mg via ORAL

## 2015-12-27 MED ORDER — SODIUM CHLORIDE 0.9 % IV SOLN: 250.0000 mL | INTRAVENOUS | Status: DC | PRN

## 2015-12-27 MED ORDER — ONDANSETRON HCL 4 MG/2ML IJ SOLN
4.0000 mg | Freq: Four times a day (QID) | INTRAMUSCULAR | Status: DC | PRN
  Administered 2015-12-27 – 2015-12-30 (×3): 4 mg via INTRAVENOUS
  Filled 2015-12-27 (×3): qty 2

## 2015-12-27 MED ORDER — FUROSEMIDE 10 MG/ML IJ SOLN
20.0000 mg | Freq: Once | INTRAMUSCULAR | Status: DC
Start: 2015-12-27 — End: 2015-12-27
  Filled 2015-12-27: qty 4

## 2015-12-27 MED ORDER — FUROSEMIDE 10 MG/ML IJ SOLN
INTRAMUSCULAR | Status: AC
  Administered 2015-12-27: 40 mg via INTRAVENOUS
  Filled 2015-12-27: qty 4

## 2015-12-27 MED ORDER — ENOXAPARIN SODIUM 40 MG/0.4ML ~~LOC~~ SOLN
40.0000 mg | SUBCUTANEOUS | Status: DC
  Administered 2015-12-27 – 2015-12-30 (×4): 40 mg via SUBCUTANEOUS
  Filled 2015-12-27 (×5): qty 0.4

## 2015-12-27 MED ORDER — POTASSIUM CHLORIDE CRYS ER 20 MEQ PO TBCR
40.0000 meq | EXTENDED_RELEASE_TABLET | Freq: Once | ORAL | Status: AC
  Administered 2015-12-27: 40 meq via ORAL
  Filled 2015-12-27: qty 2

## 2015-12-27 MED ORDER — ACETAMINOPHEN 325 MG PO TABS
650.0000 mg | ORAL_TABLET | Freq: Four times a day (QID) | ORAL | Status: DC | PRN
  Administered 2015-12-27 – 2015-12-31 (×5): 650 mg via ORAL
  Filled 2015-12-27 (×5): qty 2

## 2015-12-27 MED ORDER — ONDANSETRON HCL 4 MG PO TABS: 4.0000 mg | ORAL_TABLET | Freq: Four times a day (QID) | ORAL | Status: DC | PRN

## 2015-12-27 MED ORDER — ASPIRIN 81 MG PO CHEW
CHEWABLE_TABLET | ORAL | Status: AC
  Administered 2015-12-27: 324 mg via ORAL
  Filled 2015-12-27: qty 4

## 2015-12-27 MED ORDER — POLYETHYLENE GLYCOL 3350 17 G PO PACK: 17.0000 g | PACK | Freq: Every day | ORAL | Status: DC | PRN

## 2015-12-27 MED ORDER — INFLUENZA VAC SPLIT QUAD 0.5 ML IM SUSY: 0.5000 mL | PREFILLED_SYRINGE | INTRAMUSCULAR | Status: DC

## 2015-12-27 MED ORDER — METOPROLOL SUCCINATE ER 25 MG PO TB24
12.5000 mg | ORAL_TABLET | Freq: Every day | ORAL | Status: DC
  Administered 2015-12-27 – 2016-01-01 (×4): 12.5 mg via ORAL
  Filled 2015-12-27 (×4): qty 1
  Filled 2015-12-27: qty 0.5

## 2015-12-27 MED ORDER — FUROSEMIDE 10 MG/ML IJ SOLN
40.0000 mg | Freq: Once | INTRAMUSCULAR | Status: AC
  Administered 2015-12-27: 40 mg via INTRAVENOUS

## 2015-12-27 MED ORDER — ASPIRIN EC 81 MG PO TBEC
81.0000 mg | DELAYED_RELEASE_TABLET | Freq: Every day | ORAL | Status: DC
  Administered 2015-12-28 – 2016-01-02 (×5): 81 mg via ORAL
  Filled 2015-12-27 (×5): qty 1

## 2015-12-27 MED ORDER — SODIUM CHLORIDE 0.9% FLUSH
3.0000 mL | Freq: Two times a day (BID) | INTRAVENOUS | Status: DC
  Administered 2015-12-27 – 2015-12-31 (×8): 3 mL via INTRAVENOUS

## 2015-12-27 MED ORDER — METHOCARBAMOL 750 MG PO TABS
750.0000 mg | ORAL_TABLET | Freq: Every day | ORAL | Status: DC
  Administered 2015-12-27 – 2016-01-01 (×6): 750 mg via ORAL
  Filled 2015-12-27 (×6): qty 1

## 2015-12-27 NOTE — Progress Notes (Signed)
Spoke with dr. Elpidio Anis to clarify potassium 40 meq 1800 once order. Per md give in addition to the 40 meq patient received this am.

## 2015-12-27 NOTE — ED Notes (Signed)
Assisted pt to bathroom, pt excertional SOB, pt back to bed, placed on 2L [place]

## 2015-12-27 NOTE — ED Notes (Signed)
Pt placed on 2L 

## 2015-12-27 NOTE — Progress Notes (Signed)
Pastoral Care and prayer provided.  °

## 2015-12-27 NOTE — H&P (Signed)
[facility] [place] [facility]   PATIENT NAME: Maureen Grant    MR#:  [id number]  DATE OF BIRTH:  May 14, 1956  DATE OF ADMISSION:  12/27/2015  PRIMARY CARE PHYSICIAN: Margaretann Loveless, PA-C   REQUESTING/REFERRING PHYSICIAN: Dr. Mayford Knife  CHIEF COMPLAINT:   Chief Complaint  Patient presents with  . Shortness of Breath    HISTORY OF PRESENT ILLNESS:  Maureen Grant  is a 60 y.o. female with a known history of fibromyalgia, anxiety, past tobacco use here with SOB, orthopnea, cough. She was treated fro bronchitis as OP and later thought to have chf and reffered to Dr. Lady Gary. Echo showed EF 60%. ST was scheduled as OP. Patient was worsening with her breathing and decided to come to the emergency room. Here patient has bilateral pleural effusions left greater than right with pulmonary edema. EKG shows hypertrophy with repolarization changes. Troponin mildly elevated 0.14 Pleuritic left chest pain on coughing.  PAST MEDICAL HISTORY:   Past Medical History  Diagnosis Date  . Depression   . DDD (degenerative disc disease), cervical   . Anxiety   . Fibromyalgia     PAST SURGICAL HISTORY:   Past Surgical History  Procedure Laterality Date  . Hx of rotator cuff surgery Right 10/07/2001    SOCIAL HISTORY:   Social History  Substance Use Topics  . Smoking status: Former Games developer  . Smokeless tobacco: Not on file  . Alcohol Use: No    FAMILY HISTORY:   Family History  Problem Relation Age of Onset  . Arthritis Mother   . Heart disease Mother   . Depression Sister   . Cancer Maternal Grandmother   . Cancer Maternal Grandfather     Lung  . Alcohol abuse Maternal Grandfather   . Diabetes Paternal Grandmother     DRUG ALLERGIES:   Allergies  Allergen Reactions  . Latex Dermatitis    REVIEW OF SYSTEMS:   Review of Systems  Constitutional: Positive for malaise/fatigue. Negative for fever, chills and weight loss.  HENT: Negative for  hearing loss and nosebleeds.   Eyes: Negative for blurred vision, double vision and pain.  Respiratory: Positive for cough and shortness of breath. Negative for hemoptysis and wheezing.   Cardiovascular: Positive for chest pain and orthopnea. Negative for palpitations and leg swelling.  Gastrointestinal: Negative for nausea, vomiting, abdominal pain, diarrhea and constipation.  Genitourinary: Negative for dysuria and hematuria.  Musculoskeletal: Negative for myalgias, back pain and falls.  Skin: Negative for rash.  Neurological: Positive for weakness. Negative for dizziness, tremors, sensory change, speech change, focal weakness, seizures and headaches.  Endo/Heme/Allergies: Does not bruise/bleed easily.  Psychiatric/Behavioral: Negative for depression and memory loss. The patient is not nervous/anxious.     MEDICATIONS AT HOME:   Prior to Admission medications   Medication Sig Start Date End Date Taking? Authorizing Provider  carvedilol (COREG) 3.125 MG tablet Take 3.125 mg by mouth 2 (two) times daily with a meal.   Yes Historical Provider, MD  HYDROcodone-homatropine (HYCODAN) 5-1.5 MG/5ML syrup Take 5 mLs by mouth every 8 (eight) hours as needed for cough. 12/18/15  Yes Alessandra Bevels Burnette, PA-C  meloxicam (MOBIC) 15 MG tablet Take 15 mg by mouth daily.  04/20/15  Yes Historical Provider, MD  methocarbamol (ROBAXIN) 750 MG tablet Take 750 mg by mouth at bedtime.  04/20/15  Yes Historical Provider, MD  PROVENTIL HFA 108 (90 Base) MCG/ACT inhaler INHALE 2 PUFFS PO Q 6 H PRN FOR WHEEZING 12/04/15  Yes Historical Provider, MD  venlafaxine XR (EFFEXOR-XR) 150 MG 24 hr capsule Take 150 mg by mouth daily with breakfast.   Yes Historical Provider, MD  mometasone-formoterol (DULERA) 200-5 MCG/ACT AERO Inhale 2 puffs into the lungs 2 (two) times daily. Patient not taking: Reported on 12/27/2015 12/18/15   Margaretann Loveless, PA-C      VITAL SIGNS:  Blood pressure 126/84, pulse 80, temperature 98.1  F (36.7 C), temperature source Oral, resp. rate 24, height  (1.753 m), weight 81.647 kg (180 lb), SpO2 99 %.  PHYSICAL EXAMINATION:  Physical Exam  GENERAL:  60 y.o.-year-old patient lying in the bed with no acute distress.  EYES: Pupils equal, round, reactive to light and accommodation. No scleral icterus. Extraocular muscles intact.  HEENT: Head atraumatic, normocephalic. Oropharynx and nasopharynx clear. No oropharyngeal erythema, moist oral mucosa  NECK:  Supple, no jugular venous distention. No thyroid enlargement, no tenderness.  LUNGS:Increased work of breathing with bilateral crackles all over  CARDIOVASCULAR: S1, S2 normal. No murmurs, rubs, or gallops.  ABDOMEN: Soft, nontender, nondistended. Bowel sounds present. No organomegaly or mass.  EXTREMITIES: No pedal edema, cyanosis, or clubbing. + 2 pedal & radial pulses b/l.   NEUROLOGIC: Cranial nerves II through XII are intact. No focal Motor or sensory deficits appreciated b/l PSYCHIATRIC: The patient is alert and oriented x 3. Good affect.  SKIN: No obvious rash, lesion, or ulcer.   LABORATORY PANEL:   CBC  Recent Labs Lab 12/27/15 0857  WBC 13.2*  HGB 12.8  HCT 40.5  PLT 372   ------------------------------------------------------------------------------------------------------------------  Chemistries   Recent Labs Lab 12/27/15 0857  NA 137  K 3.2*  CL 99*  CO2 26  GLUCOSE 133*  BUN 14  CREATININE 1.05*  CALCIUM 8.7*   ------------------------------------------------------------------------------------------------------------------  Cardiac Enzymes  Recent Labs Lab 12/27/15 0857  TROPONINI 0.14*   ------------------------------------------------------------------------------------------------------------------  RADIOLOGY:  Dg Chest 2 View  12/27/2015  CLINICAL DATA:  Shortness of breath and chest pain for 2 months EXAM: CHEST  2 VIEW COMPARISON:  Chest CT December 20, 2015 FINDINGS: There is  a sizable left pleural effusion with atelectasis/ consolidation in portions of the lingula and left lower lobe. There is a minimal right effusion. The right lung is otherwise clear. Heart is upper normal in size with pulmonary vascular within normal limits. No adenopathy. No bone lesions. IMPRESSION: Sizable left pleural effusion with atelectasis/ consolidation in portions of the lingula and left lower lobe. There is increased opacity in this area compared to recent CT. Minimal right effusion. Right lung otherwise clear. No change in cardiac silhouette. Electronically Signed   By: Bretta Bang III M.D.   On: 12/27/2015 09:14     IMPRESSION AND PLAN:   * Acute on chronic diastolic congestive heart failure - IV Lasix, Beta blockers - Input and Output - Counseled to limit fluids and Salt - Monitor Bun/Cr and Potassium - Echo done recently shows ejection fraction 60% -Cardiology follow up after discharge  * Elevated troponin Likely from congestive heart failure. Patient was supposed to have outpatient stress test. Scheduled as inpatient for further workup of her congestive heart failure.  * Hypokalemia Replace potassium  * DVT prophylaxis with Lovenox    All the records are reviewed and case discussed with ED provider. Management plans discussed with the patient, family and they are in agreement.  CODE STATUS: FULL  TOTAL TIME TAKING CARE OF THIS PATIENT: 45 minutes.    Milagros Loll R M.D on 12/27/2015 at  10:38 AM  Between 7am to 6pm - Pager - [phone]  After 6pm go to [email or web address] - password [email or web address] [facility]  [facility]  Office  [phone]  CC: Primary care physician; Margaretann Loveless, PA-C     Note: This dictation was prepared with Dragon dictation along with smaller phrase technology. Any transcriptional errors that result from this process are unintentional.

## 2015-12-27 NOTE — ED Notes (Signed)
Dr. Mayford Knife notified of critical trop of 0.14

## 2015-12-27 NOTE — ED Notes (Signed)
Pt states SOB, nausea and chest pain, states "no one is helping me", MD at bedside, pt states she is to have a stress test today but i am "too sick", denies any cough, states she sits in the chair at night, pt speaking in full sentances

## 2015-12-27 NOTE — ED Notes (Signed)
Pt resting in bed with eyes closed, pt states no chest pain at this time

## 2015-12-27 NOTE — ED Notes (Signed)
Pt returned from xray, resting in bed 

## 2015-12-27 NOTE — ED Notes (Signed)
admiting MD at bedside.  

## 2015-12-27 NOTE — ED Provider Notes (Signed)
[facility] Emergency Department Provider Note     Time seen: ----------------------------------------- 9:00 AM on 12/27/2015 -----------------------------------------    I have reviewed the triage vital signs and the nursing notes.   HISTORY  Chief Complaint Shortness of Breath    HPI Maureen Grant is a 60 y.o. female who presents ER with shortness of breath, nausea and chest pain. Patient states no one's helping her, states she's to have a stress test today which she's too sick. She denies any cough but states she's had increased shortness of breath and reportedly has an EF of around 60%. She been treated for 3 weeks for bronchitis but was told subsequently this was more of a heart problem. She does not take diuretics. She denies fever or chills. Exertion or laying flat makes her symptoms worse.   Past Medical History  Diagnosis Date  . Depression   . DDD (degenerative disc disease), cervical   . Anxiety   . Fibromyalgia     Patient Active Problem List   Diagnosis Date Noted  . Anxiety 10/29/2015  . Clinical depression 10/29/2015  . Alcoholism in family 10/29/2015  . Fibrositis 10/29/2015  . History of digestive disease 10/29/2015  . Encounter for long-term (current) use of non-steroidal anti-inflammatories 10/29/2015  . Scoliosis of thoracic spine 10/29/2015  . Weight loss 10/29/2015  . DDD (degenerative disc disease), cervical     Past Surgical History  Procedure Laterality Date  . Hx of rotator cuff surgery Right 10/07/2001    Allergies Latex  Social History Social History  Substance Use Topics  . Smoking status: Current Some Day Smoker  . Smokeless tobacco: None  . Alcohol Use: No    Review of Systems Constitutional: Negative for fever. Eyes: Negative for visual changes. ENT: Negative for sore throat. Cardiovascular: Negative for chest pain. Respiratory: Positive for shortness of breath and cough Gastrointestinal:  Negative for abdominal pain, vomiting and diarrhea. Genitourinary: Negative for dysuria. Musculoskeletal: Negative for back pain. Skin: Negative for rash. Neurological: Negative for headaches, focal weakness or numbness.  10-point ROS otherwise negative.  ____________________________________________   PHYSICAL EXAM:  VITAL SIGNS: ED Triage Vitals  Enc Vitals Group     BP 12/27/15 0846 104/66 mmHg     Pulse Rate 12/27/15 0846 82     Resp 12/27/15 0846 22     Temp 12/27/15 0846 98.1 F (36.7 C)     Temp Source 12/27/15 0846 Oral     SpO2 12/27/15 0846 97 %     Weight 12/27/15 0846 180 lb (81.647 kg)     Height 12/27/15 0846  (1.753 m)     Head Cir --      Peak Flow --      Pain Score --      Pain Loc --      Pain Edu? --      Excl. in GC? --     Constitutional: Alert and oriented. Anxious but in no distress Eyes: Conjunctivae are normal. PERRL. Normal extraocular movements. ENT   Head: Normocephalic and atraumatic.   Nose: No congestion/rhinnorhea.   Mouth/Throat: Mucous membranes are moist.   Neck: No stridor. Cardiovascular: Normal rate, regular rhythm. Normal and symmetric distal pulses are present in all extremities. No murmurs, rubs, or gallops. Respiratory: Normal respiratory effort without tachypnea nor retractions. Breath sounds are clear and equal bilaterally. No wheezes/rales/rhonchi. Gastrointestinal: Soft and nontender. No distention. No abdominal bruits.  Musculoskeletal: Nontender with normal range of motion in all extremities.  No joint effusions.  No lower extremity tenderness nor edema. Neurologic:  Normal speech and language. No gross focal neurologic deficits are appreciated. Speech is normal. No gait instability. Skin:  Skin is warm, dry and intact. No rash noted. Psychiatric: Mood and affect are normal. Speech and behavior are normal. Patient exhibits appropriate insight and  judgment. ____________________________________________  EKG: Interpreted by me. Normal sinus rhythm with a rate of 82 bpm, normal PR interval, normal QRS, normal QT intervals, LVH with repolarization abnormality, septal infarct age indeterminate  ____________________________________________  ED COURSE:  Pertinent labs & imaging results that were available during my care of the patient were reviewed by me and considered in my medical decision making (see chart for details). Dyspnea of unclear etiology. I will check cardiac labs and reevaluate. ____________________________________________    LABS (pertinent positives/negatives)  Labs Reviewed  CBC WITH DIFFERENTIAL/PLATELET - Abnormal; Notable for the following:    WBC 13.2 (*)    MCHC 31.7 (*)    RDW 15.3 (*)    Neutro Abs 10.6 (*)    Monocytes Absolute 1.1 (*)    All other components within normal limits  BASIC METABOLIC PANEL - Abnormal; Notable for the following:    Potassium 3.2 (*)    Chloride 99 (*)    Glucose, Bld 133 (*)    Creatinine, Ser 1.05 (*)    Calcium 8.7 (*)    GFR calc non Af Amer 57 (*)    All other components within normal limits  BRAIN NATRIURETIC PEPTIDE - Abnormal; Notable for the following:    B Natriuretic Peptide 1185.0 (*)    All other components within normal limits  TROPONIN I - Abnormal; Notable for the following:    Troponin I 0.14 (*)    All other components within normal limits    RADIOLOGY Images were viewed by me  Chest x-ray IMPRESSION: Sizable left pleural effusion with atelectasis/ consolidation in portions of the lingula and left lower lobe. There is increased opacity in this area compared to recent CT. Minimal right effusion. Right lung otherwise clear. No change in cardiac silhouette. ____________________________________________  FINAL ASSESSMENT AND PLAN  Dyspnea, congestive heart failure, elevated troponin  Plan: Patient with labs and imaging as dictated above. Patient  with recently diagnosed cardiomyopathy and low ejection fraction. I'll start her on diuretics to see if this improves her symptoms. She also need thoracentesis. Troponin is likely elevated from significant cardiomyopathy but will need to be rechecked.   Emily Filbert, MD   Emily Filbert, MD 12/27/15 [phone]

## 2015-12-27 NOTE — Progress Notes (Signed)
Patient admitted to floor. C/o nausea, medicated with zofran. Patient a@o . Sob with exertion. Currently on acute 02 at 2l. Telemetry box verified by rn and cna. Skin verified by second nurse victoria jones

## 2015-12-27 NOTE — ED Notes (Signed)
Patient transported to X-ray 

## 2015-12-27 NOTE — ED Notes (Signed)
Pt arrived a&o, pale.  Reports sob, dx recently with bronchitis and then told by Dr Mayo Ao she had a "heart problem"

## 2015-12-28 ENCOUNTER — Encounter: Payer: Self-pay | Admitting: Radiology

## 2015-12-28 LAB — CBC
HEMATOCRIT: 40.4 % (ref 35.0–47.0)
HEMOGLOBIN: 13 g/dL (ref 12.0–16.0)
MCH: 27.7 pg (ref 26.0–34.0)
MCHC: 32.3 g/dL (ref 32.0–36.0)
MCV: 85.8 fL (ref 80.0–100.0)
PLATELETS: 356 10*3/uL (ref 150–440)
RBC: 4.72 MIL/uL (ref 3.80–5.20)
RDW: 15 % — ABNORMAL HIGH (ref 11.5–14.5)
WBC: 10.5 10*3/uL (ref 3.6–11.0)

## 2015-12-28 LAB — BASIC METABOLIC PANEL
ANION GAP: 11 (ref 5–15)
BUN: 15 mg/dL (ref 6–20)
CHLORIDE: 99 mmol/L — AB (ref 101–111)
CO2: 30 mmol/L (ref 22–32)
Calcium: 8.8 mg/dL — ABNORMAL LOW (ref 8.9–10.3)
Creatinine, Ser: 1.01 mg/dL — ABNORMAL HIGH (ref 0.44–1.00)
GFR calc non Af Amer: >60 mL/min (ref >60)
Glucose, Bld: 105 mg/dL — ABNORMAL HIGH (ref 65–99)
POTASSIUM: 4.8 mmol/L (ref 3.5–5.1)
SODIUM: 140 mmol/L (ref 135–145)

## 2015-12-28 LAB — TROPONIN I: Troponin I: 0.12 ng/mL — ABNORMAL HIGH (ref <0.031)

## 2015-12-28 MED ORDER — PROMETHAZINE HCL 25 MG/ML IJ SOLN
12.5000 mg | Freq: Once | INTRAMUSCULAR | Status: AC
  Administered 2015-12-28: 12.5 mg via INTRAVENOUS
  Filled 2015-12-28: qty 1

## 2015-12-28 MED ORDER — TECHNETIUM TC 99M SESTAMIBI - CARDIOLITE
13.0200 | Freq: Once | INTRAVENOUS | Status: AC | PRN
  Administered 2015-12-28: 13.02 via INTRAVENOUS

## 2015-12-28 MED ORDER — REGADENOSON 0.4 MG/5ML IV SOLN
0.4000 mg | Freq: Once | INTRAVENOUS | Status: AC
  Administered 2015-12-28: 0.4 mg via INTRAVENOUS
  Filled 2015-12-28: qty 5

## 2015-12-28 MED ORDER — TECHNETIUM TC 99M SESTAMIBI - CARDIOLITE
29.2890 | Freq: Once | INTRAVENOUS | Status: AC | PRN
  Administered 2015-12-28: 11:00:00 29.289 via INTRAVENOUS

## 2015-12-28 MED ORDER — GABAPENTIN 100 MG PO CAPS
100.0000 mg | ORAL_CAPSULE | Freq: Three times a day (TID) | ORAL | Status: DC
  Filled 2015-12-28 (×2): qty 1

## 2015-12-28 NOTE — Progress Notes (Signed)
Patient has been alert and oriented. Independent in the room. No complaints of pain. Only complaint was nausea later in the shift that got a little better with zofran, but it now back. Patient states "there is nothing we can do" and she has been struggling with nausea at home for a few weeks now. No emesis, but did dry heave once. Stress test performed today, no result yet. IV lasix given this evening, but was held earlier due to hypotension as well has metoprolol held, Dr. Winona Legato is aware. Will continue to monitor.

## 2015-12-28 NOTE — Progress Notes (Signed)
Initial Nutrition Assessment   INTERVENTION:   Meals and Snacks: Cater to patient preferences, will send small snacks between meals. Encouraged lighter foods like soups, jello, sherbet until nausea improves Education: pt verbalized understanding of heart healthy diet, did not provide diet education at this time. Will offer further diet education on follow-up   NUTRITION DIAGNOSIS:   Inadequate oral intake related to poor appetite, nausea as evidenced by per patient/family report.  GOAL:   Patient will meet greater than or equal to 90% of their needs   MONITOR:    (Energy Intake, Anthropometrics, Digestive System, Electrolyte/Renal Profile)  REASON FOR ASSESSMENT:   Consult Poor PO  ASSESSMENT:    Pt admitted with acute on chronic CHF  Past Medical History  Diagnosis Date  . Depression   . DDD (degenerative disc disease), cervical   . Anxiety   . Fibromyalgia     Diet Order:  Diet Heart Room service appropriate?: Yes; Fluid consistency:: Thin   Energy Intake: recorded po intake 0% at dinner last night, NPO at Breakfast this AM. Pt reports she did eat some lunch today but now feels nauseated, thinks she could tolerate "lighter" things better  Food and nutrition related history: pt reports poor po intake at home for last several weeks due to nausea  Electrolyte and Renal Profile:  Recent Labs Lab 12/27/15 0857 12/28/15 0426  BUN 14 15  CREATININE 1.05* 1.01*  NA 137 140  K 3.2* 4.8   Glucose Profile: No results for input(s): GLUCAP in the last 72 hours. Meds: lasix, potassium chloride  Nutrition Focused Physical Exam: Nutrition-Focused physical exam completed. Findings are WDL for fat depletion and muscle depletion. Mild edema.   Weight:  Ht Readings from Last 1 Encounters:  12/27/15  (1.753 m)    Weight: pt reports 20 pound wt loss in past 6 weeks; 10.2% wt loss. Per weight encounters, 3.3% wt loss in 10 days per weight encounters. 8.3% wt loss in  almost 2 months  Wt Readings from Last 1 Encounters:  12/28/15 177 lb 9.6 oz (80.559 kg)    Filed Weights   12/27/15 0846 12/27/15 1459 12/28/15 0401  Weight: 180 lb (81.647 kg) 175 lb 4.8 oz (79.516 kg) 177 lb 9.6 oz (80.559 kg)   Wt Readings from Last 10 Encounters:  12/28/15 177 lb 9.6 oz (80.559 kg)  12/18/15 183 lb 6.4 oz (83.19 kg)  11/05/15 193 lb 9.6 oz (87.816 kg)  10/29/15 190 lb 3.2 oz (86.274 kg)  04/20/15 186 lb 6.4 oz (84.55 kg)    BMI:  Body mass index is 26.22 kg/(m^2).  Estimated Nutritional Needs:   Kcal:  0981-1914 kcals (BEE 1450, 1.3 AF, 1.0-1.2 IF)   Protein:  81-97 g (1.0-1.2 g/kg)   Fluid:  2025-2400 mL (25-30 ml/kg)     MODERATE Care Level   Romelle Starcher MS, RD, LDN [phone] Pager  [phone] Weekend/On-Call Pager

## 2015-12-28 NOTE — Progress Notes (Signed)
Patient continues to complain of nausea. Received one dose of IV Zofran this afternoon, but it has not been effective. Notified Dr. Clint Guy and given orders for one time dose of 12.5mg  IV Phenergan. Nursing staff will continue to monitor. Lamonte Richer, RN

## 2015-12-28 NOTE — Clinical Social Work Note (Signed)
Clinical Social Work Assessment  Patient Details  Name: Maureen Grant MRN: [id number] Date of Birth: 05-Nov-1956  Date of referral:  12/28/15               Reason for consult:  Discharge Planning                Permission sought to share information with:    Permission granted to share information::  No  Name::        Agency::     Relationship::     Contact Information:     Housing/Transportation Living arrangements for the past 2 months:  Single Family Home Source of Information:  Event organiser Patient Interpreter Needed:  None Criminal Activity/Legal Involvement Pertinent to Current Situation/Hospitalization:  No - Comment as needed Significant Relationships:  Friend, Other Family Members Lives with:  Self Do you feel safe going back to the place where you live?  Yes Need for family participation in patient care:  No (Coment)  Care giving concerns:  CSW was consulted for Crisis Counseling.    Social Worker assessment / plan:    CSW was consulted for Crisis Counseling. CSW met with patient at bedside. Per patient she lives at home alone with her dogs. She reports that she feels safe with returning home. Patient reports that her mother recently passed away. She stated she's been "coping" well with her mother passing. She reports that she does not want to go home with O2 or with her cough. She stated that she does not feel she will need SNF placement at discharge. She also declined mental health resources. Patient reports that she does not have CSW needs. CSW is signing off. If a CSW need were to arise CSW is available.   Employment status:    Forensic scientist:  Other (Comment Required) ([employer] ) PT Recommendations:  Not assessed at this time Information / Referral to community resources:     Patient/Family's Response to care:  Patient reports that she is going to discharge home and that she does not have any CSW needs.   Patient/Family's Understanding of  and Emotional Response to Diagnosis, Current Treatment, and Prognosis:  Patient was calm and pleasant. She reports she understands CSW's role. Patient declined CSW's assistance at this time.   Emotional Assessment Appearance:  Appears stated age Attitude/Demeanor/Rapport:   (None ) Affect (typically observed):  Calm, Pleasant, Accepting Orientation:  Oriented to Self, Oriented to Place, Oriented to  Time, Oriented to Situation Alcohol / Substance use:  Not Applicable Psych involvement (Current and /or in the community):  No (Comment)  Discharge Needs  Concerns to be addressed:  Discharge Planning Concerns Readmission within the last 30 days:    Current discharge risk:  Chronically ill Barriers to Discharge:  Continued Medical Work up   [employer], LCSW 12/28/2015, 3:43 PM

## 2015-12-28 NOTE — Care Management Note (Signed)
Case Management Note  Patient Details  Name: Maureen Grant MRN: [id number] Date of Birth: February 23, 1956  Subjective/Objective:  RNCM assessment for medication needs. Met with patient at bedside. She states she lives alone. Her mother died 6 days ago so she has been dealing with a lot of stress. The death was sudden. Patient is independent, active and drives. She uses no assistive devices. Patient has insurance through [employer]. She denies issues paying her copays or other medication needs. Current with PCP, Dr. Marlyn Corporal.                  Action/Plan: No needs anticipated. Case closed.   Expected Discharge Date:                  Expected Discharge Plan:  Home/Self Care  In-House Referral:     Discharge [place] not met per provider  Post Acute Care Choice:    Choice offered to:     DME Arranged:    DME Agency:     HH Arranged:    HH Agency:     Status of Service:  Completed, signed off  Medicare Important Message Given:    Date Medicare IM Given:    Medicare IM give by:    Date Additional Medicare IM Given:    Additional Medicare Important Message give by:     If discussed at [place] of Stay Meetings, dates discussed:    Additional Comments:  Jolly Mango, RN 12/28/2015, 1:48 PM

## 2015-12-28 NOTE — Progress Notes (Signed)
[facility] - [place] at [facility]   PATIENT NAME: Maureen Grant    MR#:  [id number]  DATE OF BIRTH:  27-Mar-1956  SUBJECTIVE:  CHIEF COMPLAINT:   Chief Complaint  Patient presents with  . Shortness of Breath   patient is a 60 year old female who presents to the hospital with complaints of shortness of breath, orthopnea, cough of some right-sided chest pains. Apparently patient was treated for bronchitis as outpatient, but later thought to have CHF and referred to Dr. Ihor Austin who had echocardiogram done which revealed normal ejection fraction. Stress test was planned as outpatient. However, patient had worsening shortness of breath and came to emergency room for further evaluation. In emergency room, her chest x-ray revealed the lateral pleural effusions, more on the left side, consolidation of the lingula and left lower lobe, increased opacity in this area as compared to prior CT.   Review of Systems  Constitutional: Negative for fever, chills and weight loss.  HENT: Negative for congestion.   Eyes: Negative for blurred vision and double vision.  Respiratory: Positive for cough and shortness of breath. Negative for sputum production and wheezing.   Cardiovascular: Positive for chest pain. Negative for palpitations, orthopnea, leg swelling and PND.  Gastrointestinal: Negative for nausea, vomiting, abdominal pain, diarrhea, constipation and blood in stool.  Genitourinary: Negative for dysuria, urgency, frequency and hematuria.  Musculoskeletal: Negative for falls.  Neurological: Negative for dizziness, tremors, focal weakness and headaches.  Endo/Heme/Allergies: Does not bruise/bleed easily.  Psychiatric/Behavioral: Negative for depression. The patient does not have insomnia.     VITAL SIGNS: Blood pressure 101/79, pulse 91, temperature 99.4 F (37.4 C), temperature source Oral, resp. rate 18, height  (1.753 m), weight 80.559 kg (177 lb 9.6 oz), SpO2 97  %.  PHYSICAL EXAMINATION:   GENERAL:  60 y.o.-year-old patient sitting in the bed with no acute distress.  EYES: Pupils equal, round, reactive to light and accommodation. No scleral icterus. Extraocular muscles intact.  HEENT: Head atraumatic, normocephalic. Oropharynx and nasopharynx clear.  NECK:  Supple, no jugular venous distention. No thyroid enlargement, no tenderness.  LUNGS: Diminished breath sounds on the left, primarily at base, no wheezing, few rales,rhonchi , no crepitation. Not using accessory muscles of respiration, able to speak in longer sentences.  CARDIOVASCULAR: S1, S2 normal. No murmurs, rubs, or gallops.  ABDOMEN: Soft, nontender, nondistended. Bowel sounds present. No organomegaly or mass.  EXTREMITIES: No pedal edema, cyanosis, or clubbing.  NEUROLOGIC: Cranial nerves II through XII are intact. Muscle strength 5/5 in all extremities. Sensation intact. Gait not checked.  PSYCHIATRIC: The patient is alert and oriented x 3.  SKIN: No obvious rash, lesion, or ulcer.   ORDERS/RESULTS REVIEWED:   CBC  Recent Labs Lab 12/27/15 0857 12/28/15 0426  WBC 13.2* 10.5  HGB 12.8 13.0  HCT 40.5 40.4  PLT 372 356  MCV 86.9 85.8  MCH 27.6 27.7  MCHC 31.7* 32.3  RDW 15.3* 15.0*  LYMPHSABS 1.4  --   MONOABS 1.1*  --   EOSABS 0.0  --   BASOSABS 0.0  --    ------------------------------------------------------------------------------------------------------------------  Chemistries   Recent Labs Lab 12/27/15 0857 12/28/15 0426  NA 137 140  K 3.2* 4.8  CL 99* 99*  CO2 26 30  GLUCOSE 133* 105*  BUN 14 15  CREATININE 1.05* 1.01*  CALCIUM 8.7* 8.8*   ------------------------------------------------------------------------------------------------------------------ estimated creatinine clearance is 69 mL/min (by C-G formula based on Cr of  1.01). ------------------------------------------------------------------------------------------------------------------  Recent Labs  12/27/15  0857  TSH 2.948    Cardiac Enzymes  Recent Labs Lab 12/27/15 0857 12/27/15 1917 12/28/15 0105  TROPONINI 0.14* 0.15* 0.12*   ------------------------------------------------------------------------------------------------------------------ Invalid input(s): POCBNP ---------------------------------------------------------------------------------------------------------------  RADIOLOGY: Dg Chest 2 View  12/27/2015  CLINICAL DATA:  Shortness of breath and chest pain for 2 months EXAM: CHEST  2 VIEW COMPARISON:  Chest CT December 20, 2015 FINDINGS: There is a sizable left pleural effusion with atelectasis/ consolidation in portions of the lingula and left lower lobe. There is a minimal right effusion. The right lung is otherwise clear. Heart is upper normal in size with pulmonary vascular within normal limits. No adenopathy. No bone lesions. IMPRESSION: Sizable left pleural effusion with atelectasis/ consolidation in portions of the lingula and left lower lobe. There is increased opacity in this area compared to recent CT. Minimal right effusion. Right lung otherwise clear. No change in cardiac silhouette. Electronically Signed   By: Bretta Bang III M.D.   On: 12/27/2015 09:14    EKG:  Orders placed or performed during the hospital encounter of 12/27/15  . ED EKG  . ED EKG  . EKG 12-Lead  . EKG 12-Lead  . ED EKG  . ED EKG    ASSESSMENT AND PLAN:  Active Problems:   CHF (congestive heart failure) (HCC)  #1. Acute on chronic diastolic CHF, improved since admission on diuretics, continue Lasix 40 mg twice daily, following kidney function closely, continue nebulizers as needed, following in's and outs #2. Renal insufficiency, stable, following closely with diuresis #3. Hypokalemia, resolved with therapy #4. Leukocytosis, patient is  not on antibiotic therapy at present, follow for symptoms of infection, chest x-ray concerning for possible infiltrate, initiate antibiotics if febrile #5. Elevated troponin, Myoview stress test  results are pending  Management plans discussed with the patient, family and they are in agreement.   DRUG ALLERGIES:  Allergies  Allergen Reactions  . Latex Dermatitis    CODE STATUS:     Code Status Orders        Start     Ordered   12/27/15 1036  Full code   Continuous     12/27/15 1036    Code Status History    Date Active Date Inactive Code Status Order ID Comments User Context   This patient has a current code status but no historical code status.      TOTAL TIME TAKING CARE OF THIS PATIENT: 40 minutes.    Katharina Caper M.D on 12/28/2015 at 5:13 PM  Between 7am to 6pm - Pager - [phone]  After 6pm go to [email or web address] - password [email or web address] [facility]  [facility]  Office  [phone]  CC: Primary care physician; Margaretann Loveless, PA-C

## 2015-12-28 NOTE — Consult Note (Signed)
[facility] Consultation Note  Patient ID: Maureen Grant, MRN: [id number], DOB/AGE: 1956-02-26 60 y.o. Admit date: 12/27/2015   Date of Consult: 12/28/2015 Primary Physician: Margaretann Loveless, PA-C Primary Cardiologist: F ATH  Chief Complaint:  Chief Complaint  Patient presents with  . Shortness of Breath   Reason for Consult: chest pain  HPI: 60 y.o. female with progressive episodes of shortness of breath and weakness and fatigue with chest pressure in the upper chest radiating into the back waxing and waning over the last month and unable to do any physical activity at this time. The patient has had recent echocardiogram showing normal LV systolic function with possible diastolic dysfunction issues. The patient has had saturation of this being diastolic dysfunction heart failure and was placed on appropriate medication management including beta blocker as well as furosemide. This is not significantly improved her symptoms. She is unable to walk across the floor. When she has had a chest x-ray there is no evidence of significant edema requiring additional medication management. She has had an abnormal EKG showing left ventricular hypertrophy and nonspecific ST and T-wave changes with repolarization abnormalities. In addition she has a troponin level 0.14 and may need further evaluation. This morning she is mildly short of breath  Past Medical History  Diagnosis Date  . Depression   . DDD (degenerative disc disease), cervical   . Anxiety   . Fibromyalgia       Surgical History:  Past Surgical History  Procedure Laterality Date  . Hx of rotator cuff surgery Right 10/07/2001     Home Meds: Prior to Admission medications   Medication Sig Start Date End Date Taking? Authorizing Provider  carvedilol (COREG) 3.125 MG tablet Take 3.125 mg by mouth 2 (two) times daily with a meal.   Yes Historical Provider, MD  HYDROcodone-homatropine (HYCODAN) 5-1.5 MG/5ML syrup Take 5  mLs by mouth every 8 (eight) hours as needed for cough. 12/18/15  Yes Alessandra Bevels Burnette, PA-C  meloxicam (MOBIC) 15 MG tablet Take 15 mg by mouth daily.  04/20/15  Yes Historical Provider, MD  methocarbamol (ROBAXIN) 750 MG tablet Take 750 mg by mouth at bedtime.  04/20/15  Yes Historical Provider, MD  PROVENTIL HFA 108 (90 Base) MCG/ACT inhaler INHALE 2 PUFFS PO Q 6 H PRN FOR WHEEZING 12/04/15  Yes Historical Provider, MD  venlafaxine XR (EFFEXOR-XR) 150 MG 24 hr capsule Take 150 mg by mouth daily with breakfast.   Yes Historical Provider, MD  mometasone-formoterol (DULERA) 200-5 MCG/ACT AERO Inhale 2 puffs into the lungs 2 (two) times daily. Patient not taking: Reported on 12/27/2015 12/18/15   Margaretann Loveless, PA-C    Inpatient Medications:  . aspirin EC  81 mg Oral Daily  . enoxaparin (LOVENOX) injection  40 mg Subcutaneous Q24H  . furosemide  40 mg Intravenous BID  . Influenza vac split quadrivalent PF  0.5 mL Intramuscular Tomorrow-1000  . meloxicam  15 mg Oral Daily  . methocarbamol  750 mg Oral QHS  . metoprolol succinate  12.5 mg Oral Daily  . potassium chloride  40 mEq Oral Daily  . sodium chloride flush  3 mL Intravenous Q12H  . sodium chloride flush  3 mL Intravenous Q12H  . venlafaxine XR  150 mg Oral Q breakfast      Allergies:  Allergies  Allergen Reactions  . Latex Dermatitis    Social History   Social History  . Marital Status: Single    Spouse Name: N/A  .  Number of Children: N/A  . Years of Education: N/A   Occupational History  . Not on file.   Social History Main Topics  . Smoking status: Former Games developer  . Smokeless tobacco: Not on file  . Alcohol Use: No  . Drug Use: No  . Sexual Activity: Not on file   Other Topics Concern  . Not on file   Social History Narrative     Family History  Problem Relation Age of Onset  . Arthritis Mother   . Heart disease Mother   . Depression Sister   . Cancer Maternal Grandmother   . Cancer Maternal  Grandfather     Lung  . Alcohol abuse Maternal Grandfather   . Diabetes Paternal Grandmother      Review of Systems Positive for shortness of breath Negative for: General:  chills, fever, night sweats or weight changes.  Cardiovascular: PND orthopnea syncope dizziness  Dermatological skin lesions rashes Respiratory: Cough congestion Urologic: Frequent urination urination at night and hematuria Abdominal: negative for nausea, vomiting, diarrhea, bright red blood per rectum, melena, or hematemesis Neurologic: negative for visual changes, and/or hearing changes  All other systems reviewed and are otherwise negative except as noted above.  Labs:  Recent Labs  12/27/15 0857 12/27/15 1917 12/28/15 0105  TROPONINI 0.14* 0.15* 0.12*   Lab Results  Component Value Date   WBC 10.5 12/28/2015   HGB 13.0 12/28/2015   HCT 40.4 12/28/2015   MCV 85.8 12/28/2015   PLT 356 12/28/2015    Recent Labs Lab 12/28/15 0426  NA 140  K 4.8  CL 99*  CO2 30  BUN 15  CREATININE 1.01*  CALCIUM 8.8*  GLUCOSE 105*   Lab Results  Component Value Date   CHOL 179 03/10/2014   HDL 73* 03/10/2014   LDLCALC 92 03/10/2014   TRIG 74 03/10/2014   No results found for: DDIMER  Radiology/Studies:  Dg Chest 2 View  12/27/2015  CLINICAL DATA:  Shortness of breath and chest pain for 2 months EXAM: CHEST  2 VIEW COMPARISON:  Chest CT December 20, 2015 FINDINGS: There is a sizable left pleural effusion with atelectasis/ consolidation in portions of the lingula and left lower lobe. There is a minimal right effusion. The right lung is otherwise clear. Heart is upper normal in size with pulmonary vascular within normal limits. No adenopathy. No bone lesions. IMPRESSION: Sizable left pleural effusion with atelectasis/ consolidation in portions of the lingula and left lower lobe. There is increased opacity in this area compared to recent CT. Minimal right effusion. Right lung otherwise clear. No change in  cardiac silhouette. Electronically Signed   By: Bretta Bang III M.D.   On: 12/27/2015 09:14   Ct Chest Wo Contrast  12/20/2015  CLINICAL DATA:  Coursing short of breath.  Cough 1/2 weeks. EXAM: CT CHEST WITHOUT CONTRAST TECHNIQUE: Multidetector CT imaging of the chest was performed following the standard protocol without IV contrast. COMPARISON:  None. FINDINGS: Mediastinum/Nodes: No axillary supraclavicular adenopathy. Mild thyroid nodularity with calcification. No mediastinal hilar or adenopathy. No pericardial fluid. Heart appears enlarged. Esophagus is normal. Lungs/Pleura: Bilateral moderate pleural effusions. No pulmonary edema or interlobular septal thickening mild basilar atelectasis. Airways are normal. Upper abdomen: Several low-attenuation lesions within the liver are likely benign but cannot be fully characterized (image 68 series 2) focal fatty infiltration along the falciform ligament. Normal adrenal glands. Musculoskeletal: Degenerate spurring of spine at multiple levels. Endplate narrowing. IMPRESSION: 1. Cardiomegaly and bilateral pleural effusions suggest mild  heart failure. 2. No edema or pulmonary infection. Electronically Signed   By: Genevive Bi M.D.   On: 12/20/2015 10:25    EKG: Normal sinus rhythm with left ventricular hypertrophy with repolarization abnormality  Weights: Filed Weights   12/27/15 0846 12/27/15 1459 12/28/15 0401  Weight: 180 lb (81.647 kg) 175 lb 4.8 oz (79.516 kg) 177 lb 9.6 oz (80.559 kg)     Physical Exam: Blood pressure 114/71, pulse 82, temperature 98.2 F (36.8 C), temperature source Oral, resp. rate 22, height  (1.753 m), weight 177 lb 9.6 oz (80.559 kg), SpO2 100 %. Body mass index is 26.22 kg/(m^2). General: Well developed, well nourished, in no acute distress. Head eyes ears nose throat: Normocephalic, atraumatic, sclera non-icteric, no xanthomas, nares are without discharge. No apparent thyromegaly and/or mass  Lungs: Normal  respiratory effort. Diffuse wheezes, basilar rales, no rhonchi.  Heart: RRR with normal S1 S2. no murmur gallop, no rub, PMI is normal size and placement, carotid upstroke normal without bruit, jugular venous pressure is normal Abdomen: Soft, non-tender, non-distended with normoactive bowel sounds. No hepatomegaly. No rebound/guarding. No obvious abdominal masses. Abdominal aorta is normal size without bruit Extremities: Trace edema. no cyanosis, no clubbing, no ulcers  Peripheral : 2+ bilateral upper extremity pulses, 2+ bilateral femoral pulses, 2+ bilateral dorsal pedal pulse Neuro: Alert and oriented. No facial asymmetry. No focal deficit. Moves all extremities spontaneously. Musculoskeletal: Normal muscle tone without kyphosis Psych:  Responds to questions appropriately with a normal affect.    Assessment: 60 year old female with progressive shortness of breath chest discomfort and elevated troponin is 0.14 and abnormal EKG and possible acute onset of diastolic dysfunction congestive heart failure  Plan: 1. Lasix for possible lower extremity edema pulmonary edema and possible diastolic dysfunction congestive heart failure 2. Lexiscan infusion Myoview for further evaluation of myocardial ischemia contributing to above 3. Continue beta blocker and possible use of ACE inhibitor if the patient does have an of blood pressure to tolerate treatment 4. Further diagnostic testing and treatment options after above  Signed, Lamar Blinks M.D. [facility] Card[facility] 12/28/2015, 8:31 AM

## 2015-12-29 ENCOUNTER — Inpatient Hospital Stay
Admit: 2015-12-29 | Discharge: 2015-12-29 | Disposition: A | Discharge status: Home / Self Care | Payer: BLUE CROSS/BLUE SHIELD | Attending: Internal Medicine | Admitting: Internal Medicine

## 2015-12-29 LAB — BASIC METABOLIC PANEL
Anion gap: 10 (ref 5–15)
BUN: 16 mg/dL (ref 6–20)
CHLORIDE: 100 mmol/L — AB (ref 101–111)
CO2: 28 mmol/L (ref 22–32)
Calcium: 8.4 mg/dL — ABNORMAL LOW (ref 8.9–10.3)
Creatinine, Ser: 0.84 mg/dL (ref 0.44–1.00)
GFR calc non Af Amer: >60 mL/min (ref >60)
Glucose, Bld: 95 mg/dL (ref 65–99)
POTASSIUM: 3.5 mmol/L (ref 3.5–5.1)
SODIUM: 138 mmol/L (ref 135–145)

## 2015-12-29 MED ORDER — INFLUENZA VAC SPLIT QUAD 0.5 ML IM SUSY
0.5000 mL | PREFILLED_SYRINGE | INTRAMUSCULAR | Status: AC
  Administered 2016-01-01: 0.5 mL via INTRAMUSCULAR
  Filled 2015-12-29: qty 0.5

## 2015-12-29 MED ORDER — LOSARTAN POTASSIUM 25 MG PO TABS
25.0000 mg | ORAL_TABLET | Freq: Every day | ORAL | Status: DC
  Administered 2015-12-29 – 2016-01-01 (×3): 25 mg via ORAL
  Filled 2015-12-29 (×4): qty 1

## 2015-12-29 NOTE — Progress Notes (Signed)
[facility] Cardiology [facility] Encounter Note  Patient: Maureen Grant / Admit Date: 12/27/2015 / Date of Encounter: 12/29/2015, 8:58 AM   Subjective: Patient is still with shortness of breath and significant cough consistent with possible congestive heart failure or cardiovascular issues  Review of Systems: Positive for: Cough and shortness of breath Negative for: Vision change, hearing change, syncope, dizziness, nausea, vomiting,diarrhea, bloody stool, stomach pain,  congestion, diaphoresis, urinary frequency, urinary pain,skin lesions, skin rashes Others previously listed  Objective: Telemetry: Normal sinus rhythm Physical Exam: Blood pressure 118/78, pulse 87, temperature 98.3 F (36.8 C), temperature source Oral, resp. rate 22, height  (1.753 m), weight 172 lb 3.2 oz (78.109 kg), SpO2 97 %. Body mass index is 25.42 kg/(m^2). General: Well developed, well nourished, in no acute distress. Head: Normocephalic, atraumatic, sclera non-icteric, no xanthomas, nares are without discharge. Neck: No apparent masses Lungs: Normal respirations with no wheezes, no rhonchi, no rales , no crackles   Heart: Regular rate and rhythm, normal S1 S2, no murmur, no rub, no gallop, PMI is normal size and placement, carotid upstroke normal without bruit, jugular venous pressure normal Abdomen: Soft, non-tender, non-distended with normoactive bowel sounds. No hepatosplenomegaly. Abdominal aorta is normal size without bruit Extremities: No edema, no clubbing, no cyanosis, no ulcers,  Peripheral: 2+ radial, 2+ femoral, 2+ dorsal pedal pulses Neuro: Alert and oriented. Moves all extremities spontaneously. Psych:  Responds to questions appropriately with a normal affect.   Intake/Output Summary (Last 24 hours) at 12/29/15 0858 Last data filed at 12/28/15 1948  Gross per 24 hour  Intake      0 ml  Output   1000 ml  Net  -1000 ml    Inpatient Medications:  . aspirin EC  81 mg Oral Daily  .  enoxaparin (LOVENOX) injection  40 mg Subcutaneous Q24H  . furosemide  40 mg Intravenous BID  . gabapentin  100 mg Oral TID  . Influenza vac split quadrivalent PF  0.5 mL Intramuscular Tomorrow-1000  . methocarbamol  750 mg Oral QHS  . metoprolol succinate  12.5 mg Oral Daily  . potassium chloride  40 mEq Oral Daily  . sodium chloride flush  3 mL Intravenous Q12H  . sodium chloride flush  3 mL Intravenous Q12H  . venlafaxine XR  150 mg Oral Q breakfast   Infusions:    Labs:  Recent Labs  12/28/15 0426 12/29/15 0447  NA 140 138  K 4.8 3.5  CL 99* 100*  CO2 30 28  GLUCOSE 105* 95  BUN 15 16  CREATININE 1.01* 0.84  CALCIUM 8.8* 8.4*   No results for input(s): AST, ALT, ALKPHOS, BILITOT, PROT, ALBUMIN in the last 72 hours.  Recent Labs  12/27/15 0857 12/28/15 0426  WBC 13.2* 10.5  NEUTROABS 10.6*  --   HGB 12.8 13.0  HCT 40.5 40.4  MCV 86.9 85.8  PLT 372 356    Recent Labs  12/27/15 0857 12/27/15 1917 12/28/15 0105  TROPONINI 0.14* 0.15* 0.12*   Invalid input(s): POCBNP No results for input(s): HGBA1C in the last 72 hours.   Weights: Filed Weights   12/27/15 1459 12/28/15 0401 12/29/15 0506  Weight: 175 lb 4.8 oz (79.516 kg) 177 lb 9.6 oz (80.559 kg) 172 lb 3.2 oz (78.109 kg)     Radiology/Studies:  Dg Chest 2 View  12/27/2015  CLINICAL DATA:  Shortness of breath and chest pain for 2 months EXAM: CHEST  2 VIEW COMPARISON:  Chest CT December 20, 2015 FINDINGS:  There is a sizable left pleural effusion with atelectasis/ consolidation in portions of the lingula and left lower lobe. There is a minimal right effusion. The right lung is otherwise clear. Heart is upper normal in size with pulmonary vascular within normal limits. No adenopathy. No bone lesions. IMPRESSION: Sizable left pleural effusion with atelectasis/ consolidation in portions of the lingula and left lower lobe. There is increased opacity in this area compared to recent CT. Minimal right effusion.  Right lung otherwise clear. No change in cardiac silhouette. Electronically Signed   By: Bretta Bang III M.D.   On: 12/27/2015 09:14   Ct Chest Wo Contrast  12/20/2015  CLINICAL DATA:  Coursing short of breath.  Cough 1/2 weeks. EXAM: CT CHEST WITHOUT CONTRAST TECHNIQUE: Multidetector CT imaging of the chest was performed following the standard protocol without IV contrast. COMPARISON:  None. FINDINGS: Mediastinum/Nodes: No axillary supraclavicular adenopathy. Mild thyroid nodularity with calcification. No mediastinal hilar or adenopathy. No pericardial fluid. Heart appears enlarged. Esophagus is normal. Lungs/Pleura: Bilateral moderate pleural effusions. No pulmonary edema or interlobular septal thickening mild basilar atelectasis. Airways are normal. Upper abdomen: Several low-attenuation lesions within the liver are likely benign but cannot be fully characterized (image 68 series 2) focal fatty infiltration along the falciform ligament. Normal adrenal glands. Musculoskeletal: Degenerate spurring of spine at multiple levels. Endplate narrowing. IMPRESSION: 1. Cardiomegaly and bilateral pleural effusions suggest mild heart failure. 2. No edema or pulmonary infection. Electronically Signed   By: Genevive Bi M.D.   On: 12/20/2015 10:25     Assessment and Recommendation  60 y.o. female with new onset significant cough shortness of breath and pulmonary edema with wheezing and lower extremity edema and minimal elevation of troponin of unknown etiology with abnormal stress test suggesting multivessel coronary artery disease and myocardial ischemia with the dilation and low ejection fraction consistent with congestive heart failure 1. Continue metoprolol for LV systolic dysfunction and congestive heart failure type symptoms 2. Further consideration of angiotensin receptor blocker for congestive heart failure thereafter 3. Possible assessment by echocardiogram for LV systolic dysfunction and valvular  heart disease contributing to above 4. Further evaluation of abnormal stress test in current symptoms with cardiac catheterization. The patient understands risk and benefits of cardiac catheterization. This includes a possibility of death stroke heart attack infection bleeding or blood clot. Patient is at low risk for conscious sedation  Signed, Arnoldo Hooker M.D. FACC

## 2015-12-29 NOTE — Progress Notes (Signed)
[facility] - [place] at [facility]   PATIENT NAME: Maureen Grant    MR#:  [id number]  DATE OF BIRTH:  07-13-1956  SUBJECTIVE:  CHIEF COMPLAINT:   Chief Complaint  Patient presents with  . Shortness of Breath   patient is a 60 year old female who presents to the hospital with complaints of shortness of breath, orthopnea, cough of some right-sided chest pains. Apparently patient was treated for bronchitis as outpatient, but later thought to have CHF and referred to Dr. Ihor Austin who had echocardiogram done which revealed normal ejection fraction. Stress test was planned as outpatient. However, patient had worsening shortness of breath and came to emergency room for further evaluation. In emergency room, her chest x-ray revealed the lateral pleural effusions, more on the left side, consolidation of the lingula and left lower lobe, increased opacity in this area as compared to prior CT. patient is admitted to the hospital and had stress test done yesterday Today during my examination patient is resting comfortably. Denies any chest pain or shortness of breath. Reporting dry cough. Scheduled for cardiac catheterization Monday    Review of Systems  Constitutional: Negative for fever, chills and weight loss.  HENT: Negative for congestion.   Eyes: Negative for blurred vision and double vision.  Respiratory: Positive for cough and shortness of breath. Negative for sputum production and wheezing.   Cardiovascular: Negative for chest pain, palpitations, orthopnea, leg swelling and PND.  Gastrointestinal: Negative for nausea, vomiting, abdominal pain, diarrhea, constipation and blood in stool.  Genitourinary: Negative for dysuria, urgency, frequency and hematuria.  Musculoskeletal: Negative for falls.  Neurological: Negative for dizziness, tremors, focal weakness and headaches.  Endo/Heme/Allergies: Does not bruise/bleed easily.  Psychiatric/Behavioral: Negative for depression.  The patient does not have insomnia.     VITAL SIGNS: Blood pressure 106/62, pulse 89, temperature 98.1 F (36.7 C), temperature source Oral, resp. rate 18, height  (1.753 m), weight 78.109 kg (172 lb 3.2 oz), SpO2 95 %.  PHYSICAL EXAMINATION:   GENERAL:  60 y.o.-year-old patient sitting in the bed with no acute distress.  EYES: Pupils equal, round, reactive to light and accommodation. No scleral icterus. Extraocular muscles intact.  HEENT: Head atraumatic, normocephalic. Oropharynx and nasopharynx clear.  NECK:  Supple, no jugular venous distention. No thyroid enlargement, no tenderness.  LUNGS: Diminished breath sounds on the left, primarily at base, no wheezing, few rales,rhonchi , no crepitation. Not using accessory muscles of respiration, able to speak in longer sentences.  CARDIOVASCULAR: S1, S2 normal. No murmurs, rubs, or gallops.  ABDOMEN: Soft, nontender, nondistended. Bowel sounds present. No organomegaly or mass.  EXTREMITIES: No pedal edema, cyanosis, or clubbing.  NEUROLOGIC: Cranial nerves II through XII are intact. Muscle strength 5/5 in all extremities. Sensation intact. Gait not checked.  PSYCHIATRIC: The patient is alert and oriented x 3.  SKIN: No obvious rash, lesion, or ulcer.   ORDERS/RESULTS REVIEWED:   CBC  Recent Labs Lab 12/27/15 0857 12/28/15 0426  WBC 13.2* 10.5  HGB 12.8 13.0  HCT 40.5 40.4  PLT 372 356  MCV 86.9 85.8  MCH 27.6 27.7  MCHC 31.7* 32.3  RDW 15.3* 15.0*  LYMPHSABS 1.4  --   MONOABS 1.1*  --   EOSABS 0.0  --   BASOSABS 0.0  --    ------------------------------------------------------------------------------------------------------------------  Chemistries   Recent Labs Lab 12/27/15 0857 12/28/15 0426 12/29/15 0447  NA 137 140 138  K 3.2* 4.8 3.5  CL 99* 99* 100*  CO2 26 30  28  GLUCOSE 133* 105* 95  BUN CREATININE 1.05* 1.01* 0.84  CALCIUM 8.7* 8.8* 8.4*    ------------------------------------------------------------------------------------------------------------------ estimated creatinine clearance is 76.3 mL/min (by C-G formula based on Cr of 0.84). ------------------------------------------------------------------------------------------------------------------  Recent Labs  12/27/15 0857  TSH 2.948    Cardiac Enzymes  Recent Labs Lab 12/27/15 0857 12/27/15 1917 12/28/15 0105  TROPONINI 0.14* 0.15* 0.12*   ------------------------------------------------------------------------------------------------------------------ Invalid input(s): POCBNP ---------------------------------------------------------------------------------------------------------------  RADIOLOGY: No results found.  EKG:  Orders placed or performed during the hospital encounter of 12/27/15  . ED EKG  . ED EKG  . EKG 12-Lead  . EKG 12-Lead  . ED EKG  . ED EKG    ASSESSMENT AND PLAN:  Active Problems:   CHF (congestive heart failure) (HCC)  #1. Acute on chronic diastolic CHF, improved since admission on diuretics, continue Lasix 40 mg twice daily, repeat echocardiogram is ordered, following kidney function closely, continue nebulizers as needed, following in's and outs #2. 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 Continue metoprolol and add ARB to her regimen #. Renal insufficiency, stable, following closely with diuresis #. Hypokalemia, resolved with therapy #. Leukocytosis, patient is not on antibiotic therapy at present, follow for symptoms of infection, chest x-ray concerning for possible infiltrate, initiate antibiotics if febrile #. Elevated troponin, Myoview stress-12/28/15 has revealed multivessel coronary artery disease #Persistent nausea-Zofran as needed and PPI   Management plans discussed with the patient, family and they are in agreement.   DRUG ALLERGIES:   Allergies  Allergen Reactions  . Latex Dermatitis    CODE STATUS:     Code Status Orders        Start     Ordered   12/27/15 1036  Full code   Continuous     12/27/15 1036    Code Status History    Date Active Date Inactive Code Status Order ID Comments User Context   This patient has a current code status but no historical code status.      TOTAL TIME TAKING CARE OF THIS PATIENT: 40 minutes.    Ramonita Lab M.D on 12/29/2015 at 1:48 PM  Between 7am to 6pm - Pager - [phone]  After 6pm go to [email or web address] - password [email or web address] [facility]  [facility]  Office  [phone]  CC: Primary care physician; Margaretann Loveless, PA-C

## 2015-12-29 NOTE — Progress Notes (Signed)
Per MD Gouru, ok to DC gabapentin, pt does not take at home and does not want this medication

## 2015-12-29 NOTE — Progress Notes (Signed)
Patient rested quietly tonight. Previous episode of nausea resolved after phenergan administered. Stress test result still pending. A&Ox4 and independent in the room. NSR on tele. Nursing staff will continue to monitor. Lamonte Richer, RN

## 2015-12-30 LAB — BASIC METABOLIC PANEL
Anion gap: 6 (ref 5–15)
BUN: 16 mg/dL (ref 6–20)
CHLORIDE: 98 mmol/L — AB (ref 101–111)
CO2: 32 mmol/L (ref 22–32)
CREATININE: 1.05 mg/dL — AB (ref 0.44–1.00)
Calcium: 8.6 mg/dL — ABNORMAL LOW (ref 8.9–10.3)
GFR calc Af Amer: >60 mL/min (ref >60)
GFR calc non Af Amer: 57 mL/min — ABNORMAL LOW (ref >60)
Glucose, Bld: 101 mg/dL — ABNORMAL HIGH (ref 65–99)
Potassium: 5 mmol/L (ref 3.5–5.1)
SODIUM: 136 mmol/L (ref 135–145)

## 2015-12-30 LAB — MAGNESIUM: MAGNESIUM: 1.9 mg/dL (ref 1.7–2.4)

## 2015-12-30 MED ORDER — SODIUM CHLORIDE 0.9 % WEIGHT BASED INFUSION
3.0000 mL/kg/h | INTRAVENOUS | Status: DC
  Administered 2015-12-31: 3 mL/kg/h via INTRAVENOUS

## 2015-12-30 MED ORDER — SODIUM CHLORIDE 0.9 % IV SOLN: 250.0000 mL | INTRAVENOUS | Status: DC | PRN

## 2015-12-30 MED ORDER — DIPHENHYDRAMINE HCL 25 MG PO CAPS
25.0000 mg | ORAL_CAPSULE | Freq: Every evening | ORAL | Status: DC | PRN
  Administered 2015-12-30 – 2015-12-31 (×2): 25 mg via ORAL
  Filled 2015-12-30 (×2): qty 1

## 2015-12-30 MED ORDER — SODIUM CHLORIDE 0.9% FLUSH: 3.0000 mL | Freq: Two times a day (BID) | INTRAVENOUS | Status: DC

## 2015-12-30 MED ORDER — ASPIRIN 81 MG PO CHEW
81.0000 mg | CHEWABLE_TABLET | ORAL | Status: AC
  Administered 2015-12-31: 81 mg via ORAL
  Filled 2015-12-30: qty 1

## 2015-12-30 MED ORDER — PROMETHAZINE HCL 25 MG/ML IJ SOLN
12.5000 mg | Freq: Three times a day (TID) | INTRAMUSCULAR | Status: DC | PRN
  Administered 2015-12-30: 12.5 mg via INTRAVENOUS
  Filled 2015-12-30: qty 1

## 2015-12-30 MED ORDER — SODIUM CHLORIDE 0.9 % WEIGHT BASED INFUSION: 1.0000 mL/kg/h | INTRAVENOUS | Status: DC

## 2015-12-30 MED ORDER — SODIUM CHLORIDE 0.9% FLUSH: 3.0000 mL | INTRAVENOUS | Status: DC | PRN

## 2015-12-30 NOTE — Progress Notes (Signed)
Alert and oriented. No complaints until this afternoon when patient's nausea returned. Zofran did not relieve nausea. Phenergen given and has relieved some of it and is continuing to help. Patient educated on cardiac catheterization and consent has been signed. No further questions. IV lasix continued. Held PO metoprolol and losartan this AM due to hypotension. Will continue to monitor.

## 2015-12-30 NOTE — Progress Notes (Signed)
[facility] Cardiology [facility] Encounter Note  Patient: Maureen Grant / Admit Date: 12/27/2015 / Date of Encounter: 12/30/2015, 6:25 AM   Subjective: Patient is still having a cough and shortness of breath but no evidence of chest discomfort today  Review of Systems: Positive for: Cough and shortness of breath Negative for: Vision change, hearing change, syncope, dizziness, nausea, vomiting,diarrhea, bloody stool, stomach pain,  congestion, diaphoresis, urinary frequency, urinary pain,skin lesions, skin rashes Others previously listed  Objective: Telemetry: Normal sinus rhythm Physical Exam: Blood pressure 104/68, pulse 85, temperature 99.3 F (37.4 C), temperature source Oral, resp. rate 16, height  (1.753 m), weight 169 lb 6.4 oz (76.839 kg), SpO2 92 %. Body mass index is 25 kg/(m^2). General: Well developed, well nourished, in no acute distress. Head: Normocephalic, atraumatic, sclera non-icteric, no xanthomas, nares are without discharge. Neck: No apparent masses Lungs: Normal respirations with few wheezes, no rhonchi, no rales , some basilar crackles   Heart: Regular rate and rhythm, normal S1 S2, no murmur, no rub, no gallop, PMI is normal size and placement, carotid upstroke normal without bruit, jugular venous pressure normal Abdomen: Soft, non-tender, non-distended with normoactive bowel sounds. No hepatosplenomegaly. Abdominal aorta is normal size without bruit Extremities: No edema, no clubbing, no cyanosis, no ulcers,  Peripheral: 2+ radial, 2+ femoral, 2+ dorsal pedal pulses Neuro: Alert and oriented. Moves all extremities spontaneously. Psych:  Responds to questions appropriately with a normal affect.   Intake/Output Summary (Last 24 hours) at 12/30/15 0625 Last data filed at 12/30/15 0502  Gross per 24 hour  Intake    360 ml  Output   1600 ml  Net  -1240 ml    Inpatient Medications:  . aspirin EC  81 mg Oral Daily  . enoxaparin (LOVENOX) injection  40  mg Subcutaneous Q24H  . furosemide  40 mg Intravenous BID  . [START ON 12/31/2015] Influenza vac split quadrivalent PF  0.5 mL Intramuscular Tomorrow-1000  . losartan  25 mg Oral Daily  . methocarbamol  750 mg Oral QHS  . metoprolol succinate  12.5 mg Oral Daily  . potassium chloride  40 mEq Oral Daily  . sodium chloride flush  3 mL Intravenous Q12H  . sodium chloride flush  3 mL Intravenous Q12H  . venlafaxine XR  150 mg Oral Q breakfast   Infusions:    Labs:  Recent Labs  12/28/15 0426 12/29/15 0447  NA 140 138  K 4.8 3.5  CL 99* 100*  CO2 30 28  GLUCOSE 105* 95  BUN 15 16  CREATININE 1.01* 0.84  CALCIUM 8.8* 8.4*   No results for input(s): AST, ALT, ALKPHOS, BILITOT, PROT, ALBUMIN in the last 72 hours.  Recent Labs  12/27/15 0857 12/28/15 0426  WBC 13.2* 10.5  NEUTROABS 10.6*  --   HGB 12.8 13.0  HCT 40.5 40.4  MCV 86.9 85.8  PLT 372 356    Recent Labs  12/27/15 0857 12/27/15 1917 12/28/15 0105  TROPONINI 0.14* 0.15* 0.12*   Invalid input(s): POCBNP No results for input(s): HGBA1C in the last 72 hours.   Weights: Filed Weights   12/28/15 0401 12/29/15 0506 12/30/15 0459  Weight: 177 lb 9.6 oz (80.559 kg) 172 lb 3.2 oz (78.109 kg) 169 lb 6.4 oz (76.839 kg)     Radiology/Studies:  Dg Chest 2 View  12/27/2015  CLINICAL DATA:  Shortness of breath and chest pain for 2 months EXAM: CHEST  2 VIEW COMPARISON:  Chest CT December 20, 2015 FINDINGS:  There is a sizable left pleural effusion with atelectasis/ consolidation in portions of the lingula and left lower lobe. There is a minimal right effusion. The right lung is otherwise clear. Heart is upper normal in size with pulmonary vascular within normal limits. No adenopathy. No bone lesions. IMPRESSION: Sizable left pleural effusion with atelectasis/ consolidation in portions of the lingula and left lower lobe. There is increased opacity in this area compared to recent CT. Minimal right effusion. Right lung  otherwise clear. No change in cardiac silhouette. Electronically Signed   By: Bretta Bang III M.D.   On: 12/27/2015 09:14   Ct Chest Wo Contrast  12/20/2015  CLINICAL DATA:  Coursing short of breath.  Cough 1/2 weeks. EXAM: CT CHEST WITHOUT CONTRAST TECHNIQUE: Multidetector CT imaging of the chest was performed following the standard protocol without IV contrast. COMPARISON:  None. FINDINGS: Mediastinum/Nodes: No axillary supraclavicular adenopathy. Mild thyroid nodularity with calcification. No mediastinal hilar or adenopathy. No pericardial fluid. Heart appears enlarged. Esophagus is normal. Lungs/Pleura: Bilateral moderate pleural effusions. No pulmonary edema or interlobular septal thickening mild basilar atelectasis. Airways are normal. Upper abdomen: Several low-attenuation lesions within the liver are likely benign but cannot be fully characterized (image 68 series 2) focal fatty infiltration along the falciform ligament. Normal adrenal glands. Musculoskeletal: Degenerate spurring of spine at multiple levels. Endplate narrowing. IMPRESSION: 1. Cardiomegaly and bilateral pleural effusions suggest mild heart failure. 2. No edema or pulmonary infection. Electronically Signed   By: Genevive Bi M.D.   On: 12/20/2015 10:25     Assessment and Recommendation  60 y.o. female with new onset significant cough shortness of breath and pulmonary edema with wheezing and lower extremity edema and minimal elevation of troponin of unknown etiology with abnormal stress test suggesting multivessel coronary artery disease and myocardial ischemia with the dilation and low ejection fraction consistent with congestive heart failure 1. Continue metoprolol for LV systolic dysfunction and congestive heart failure type symptoms 2. Further consideration of angiotensin receptor blocker for congestive heart failure thereafter 3. Repeat echocardiogram pending 4. Further evaluation of abnormal stress test in current  symptoms with cardiac catheterization. The patient understands risk and benefits of cardiac catheterization. This includes a possibility of death stroke heart attack infection bleeding or blood clot. Patient is at low risk for conscious sedation. Patient has no other further questions until 2 performed cardiac catheterization  Signed, Arnoldo Hooker M.D. FACC

## 2015-12-30 NOTE — Progress Notes (Signed)
[facility] [place] at [facility]   PATIENT NAME: Maureen Grant    MR#:  [id number]  DATE OF BIRTH:  06-04-1956  SUBJECTIVE:  CHIEF COMPLAINT:   Chief Complaint  Patient presents with  . Shortness of Breath   patient is a 60 year old female who presents to the hospital with complaints of shortness of breath, orthopnea, cough of some right-sided chest pains. Apparently patient was treated for bronchitis as outpatient, but later thought to have CHF and referred to Dr. Ihor Austin who had echocardiogram done which revealed normal ejection fraction. Stress test was planned as outpatient. However, patient had worsening shortness of breath and came to emergency room for further evaluation. In emergency room, her chest x-ray revealed the lateral pleural effusions, more on the left side, consolidation of the lingula and left lower lobe, increased opacity in this area as compared to prior CT. patient is admitted to the hospital and had stress test done 12/28/2015 Today during my examination patient is resting comfortably. He slept well last night. Reporting dry cough intermittently Denies any chest pain or shortness of breath. Scheduled for cardiac catheterization Monday    Review of Systems  Constitutional: Negative for fever, chills and weight loss.  HENT: Negative for congestion.   Eyes: Negative for blurred vision and double vision.  Respiratory: Positive for cough and shortness of breath. Negative for sputum production and wheezing.   Cardiovascular: Negative for chest pain, palpitations, orthopnea, leg swelling and PND.  Gastrointestinal: Negative for nausea, vomiting, abdominal pain, diarrhea, constipation and blood in stool.  Genitourinary: Negative for dysuria, urgency, frequency and hematuria.  Musculoskeletal: Negative for falls.  Neurological: Negative for dizziness, tremors, focal weakness and headaches.  Endo/Heme/Allergies: Does not bruise/bleed easily.   Psychiatric/Behavioral: Negative for depression. The patient does not have insomnia.     VITAL SIGNS: Blood pressure 103/66, pulse 90, temperature 99.3 F (37.4 C), temperature source Oral, resp. rate 18, height  (1.753 m), weight 76.839 kg (169 lb 6.4 oz), SpO2 91 %.  PHYSICAL EXAMINATION:   GENERAL:  60 y.o.-year-old patient sitting in the bed with no acute distress.  EYES: Pupils equal, round, reactive to light and accommodation. No scleral icterus. Extraocular muscles intact.  HEENT: Head atraumatic, normocephalic. Oropharynx and nasopharynx clear.  NECK:  Supple, no jugular venous distention. No thyroid enlargement, no tenderness.  LUNGS: Diminished breath sounds on the left, primarily at base, no wheezing, few rales,rhonchi , no crepitation. Not using accessory muscles of respiration, able to speak in longer sentences.  CARDIOVASCULAR: S1, S2 normal. No murmurs, rubs, or gallops.  ABDOMEN: Soft, nontender, nondistended. Bowel sounds present. No organomegaly or mass.  EXTREMITIES: No pedal edema, cyanosis, or clubbing.  NEUROLOGIC: Cranial nerves II through XII are intact. Muscle strength 5/5 in all extremities. Sensation intact. Gait not checked.  PSYCHIATRIC: The patient is alert and oriented x 3.  SKIN: No obvious rash, lesion, or ulcer.   ORDERS/RESULTS REVIEWED:   CBC  Recent Labs Lab 12/27/15 0857 12/28/15 0426  WBC 13.2* 10.5  HGB 12.8 13.0  HCT 40.5 40.4  PLT 372 356  MCV 86.9 85.8  MCH 27.6 27.7  MCHC 31.7* 32.3  RDW 15.3* 15.0*  LYMPHSABS 1.4  --   MONOABS 1.1*  --   EOSABS 0.0  --   BASOSABS 0.0  --    ------------------------------------------------------------------------------------------------------------------  Chemistries   Recent Labs Lab 12/27/15 0857 12/28/15 0426 12/29/15 0447 12/30/15 0658  NA 137 140 138 136  K 3.2* 4.8 3.5  5.0  CL 99* 99* 100* 98*  CO2 32  GLUCOSE 133* 105* 95 101*  BUN CREATININE 1.05*  1.01* 0.84 1.05*  CALCIUM 8.7* 8.8* 8.4* 8.6*  MG  --   --   --  1.9   ------------------------------------------------------------------------------------------------------------------ estimated creatinine clearance is 61 mL/min (by C-G formula based on Cr of 1.05). ------------------------------------------------------------------------------------------------------------------ No results for input(s): TSH, T4TOTAL, T3FREE, THYROIDAB in the last 72 hours.  Invalid input(s): FREET3  Cardiac Enzymes  Recent Labs Lab 12/27/15 0857 12/27/15 1917 12/28/15 0105  TROPONINI 0.14* 0.15* 0.12*   ------------------------------------------------------------------------------------------------------------------ Invalid input(s): POCBNP ---------------------------------------------------------------------------------------------------------------  RADIOLOGY: No results found.  EKG:  Orders placed or performed during the hospital encounter of 12/27/15  . ED EKG  . ED EKG  . EKG 12-Lead  . EKG 12-Lead  . ED EKG  . ED EKG    ASSESSMENT AND PLAN:  Active Problems:   CHF (congestive heart failure) (HCC)  #1. Acute on chronic diastolic CHF  improved since admission on diuretics, continue Lasix 40 mg twice daily,  repeat echocardiogram with 15% left ventricular ejection fraction   following in's and outs ARB is added to the regimen Continue metoprolol  #2. 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 Labs . Continue metoprolol and add ARB to her regimen  #. Renal insufficiency, stable, following closely with diuresis  #. Hypokalemia, resolved with therapy  #. Leukocytosis, patient is not on antibiotic therapy at present, follow for symptoms of infection, chest x-ray concerning for possible infiltrate, initiate antibiotics if febrile  #. Elevated troponin, Myoview  stress-12/28/15 has revealed multivessel coronary artery disease  #Persistent nausea-Zofran as needed and PPI   Management plans discussed with the patient, family and they are in agreement.   DRUG ALLERGIES:  Allergies  Allergen Reactions  . Latex Dermatitis    CODE STATUS:     Code Status Orders        Start     Ordered   12/27/15 1036  Full code   Continuous     12/27/15 1036    Code Status History    Date Active Date Inactive Code Status Order ID Comments User Context   This patient has a current code status but no historical code status.      TOTAL TIME TAKING CARE OF THIS PATIENT: 35 minutes.    Ramonita Lab M.D on 12/30/2015 at 1:16 PM  Between 7am to 6pm - Pager - [phone]  After 6pm go to [email or web address] - password [email or web address] [facility]  [facility]  Office  [phone]  CC: Primary care physician; Margaretann Loveless, PA-C

## 2015-12-30 NOTE — Progress Notes (Signed)
Pt had 13 beats of vtach- MD, Dr. Joneen Roach made aware- will wait for lab work to result from previous order.  Pt still asymptomatic.  Pt requesting benadryl for sleep- MD approved prn benadryl 25 mg as needed for sleep.  Will continue to monitor. Louis Meckel

## 2015-12-30 NOTE — Progress Notes (Addendum)
Pt had 8 beats of vtach- pt asymptomatic.  MD. Dr. Joneen Roach notified.  MD gave verbal order for BMP and Mag checks.  Will place lab work orders. Lab notified of new orders. Will continue to monitor Maureen Grant

## 2015-12-30 NOTE — Progress Notes (Signed)
Lab notified for the third time about BMP and Mag that were ordered at 0037.  Lab reports someone is on the way to draw it. Maureen Grant

## 2015-12-31 ENCOUNTER — Encounter
Admission: EM | Disposition: A | Discharge status: Home / Self Care | Payer: Self-pay | Source: Home / Self Care | Attending: Internal Medicine

## 2015-12-31 ENCOUNTER — Inpatient Hospital Stay: Payer: BLUE CROSS/BLUE SHIELD

## 2015-12-31 ENCOUNTER — Encounter: Payer: Self-pay | Admitting: Internal Medicine

## 2015-12-31 HISTORY — PX: CARDIAC CATHETERIZATION: SHX172

## 2015-12-31 LAB — CBC
HCT: 41.4 % (ref 35.0–47.0)
HEMOGLOBIN: 13.3 g/dL (ref 12.0–16.0)
MCH: 27.2 pg (ref 26.0–34.0)
MCHC: 32.2 g/dL (ref 32.0–36.0)
MCV: 84.4 fL (ref 80.0–100.0)
PLATELETS: 412 10*3/uL (ref 150–440)
RBC: 4.9 MIL/uL (ref 3.80–5.20)
RDW: 14.7 % — ABNORMAL HIGH (ref 11.5–14.5)
WBC: 7.9 10*3/uL (ref 3.6–11.0)

## 2015-12-31 LAB — BASIC METABOLIC PANEL
Anion gap: 6 (ref 5–15)
BUN: 16 mg/dL (ref 6–20)
CHLORIDE: 98 mmol/L — AB (ref 101–111)
CO2: 33 mmol/L — ABNORMAL HIGH (ref 22–32)
CREATININE: 1.11 mg/dL — AB (ref 0.44–1.00)
Calcium: 8.6 mg/dL — ABNORMAL LOW (ref 8.9–10.3)
GFR, EST NON AFRICAN AMERICAN: 54 mL/min — AB (ref >60)
Glucose, Bld: 104 mg/dL — ABNORMAL HIGH (ref 65–99)
Potassium: 4.8 mmol/L (ref 3.5–5.1)
SODIUM: 137 mmol/L (ref 135–145)

## 2015-12-31 LAB — HEPARIN LEVEL (UNFRACTIONATED): HEPARIN UNFRACTIONATED: 0.24 [IU]/mL — AB (ref 0.30–0.70)

## 2015-12-31 LAB — PROTIME-INR
INR: 1.21
PROTHROMBIN TIME: 15.5 s — AB (ref 11.4–15.0)

## 2015-12-31 LAB — APTT: APTT: 26 s (ref 24–36)

## 2015-12-31 SURGERY — RIGHT/LEFT HEART CATH AND CORONARY ANGIOGRAPHY
Anesthesia: Moderate Sedation

## 2015-12-31 MED ORDER — LEVOFLOXACIN IN D5W 750 MG/150ML IV SOLN
750.0000 mg | INTRAVENOUS | Status: DC
  Administered 2016-01-01 – 2016-01-02 (×2): 750 mg via INTRAVENOUS
  Filled 2015-12-31 (×2): qty 150

## 2015-12-31 MED ORDER — MIDAZOLAM HCL 2 MG/2ML IJ SOLN
INTRAMUSCULAR | Status: AC
  Filled 2015-12-31: qty 2

## 2015-12-31 MED ORDER — ACETAMINOPHEN 325 MG PO TABS: 650.0000 mg | ORAL_TABLET | ORAL | Status: DC | PRN

## 2015-12-31 MED ORDER — MIDAZOLAM HCL 2 MG/2ML IJ SOLN
INTRAMUSCULAR | Status: DC | PRN
  Administered 2015-12-31: 1 mg via INTRAVENOUS

## 2015-12-31 MED ORDER — TECHNETIUM TO 99M ALBUMIN AGGREGATED
3.9200 | Freq: Once | INTRAVENOUS | Status: AC | PRN
  Administered 2015-12-31: 3.92 via INTRAVENOUS

## 2015-12-31 MED ORDER — SODIUM CHLORIDE 0.9 % WEIGHT BASED INFUSION
3.0000 mL/kg/h | INTRAVENOUS | Status: DC
  Administered 2015-12-31: 3 mL/kg/h via INTRAVENOUS

## 2015-12-31 MED ORDER — LEVOFLOXACIN IN D5W 750 MG/150ML IV SOLN
750.0000 mg | Freq: Once | INTRAVENOUS | Status: AC
  Administered 2015-12-31: 750 mg via INTRAVENOUS
  Filled 2015-12-31: qty 150

## 2015-12-31 MED ORDER — TECHNETIUM TC 99M DIETHYLENETRIAME-PENTAACETIC ACID
32.4600 | Freq: Once | INTRAVENOUS | Status: AC | PRN
  Administered 2015-12-31: 32.46 via INTRAVENOUS

## 2015-12-31 MED ORDER — HEPARIN (PORCINE) IN NACL 2-0.9 UNIT/ML-% IJ SOLN
INTRAMUSCULAR | Status: AC
  Filled 2015-12-31: qty 1000

## 2015-12-31 MED ORDER — PIPERACILLIN-TAZOBACTAM 3.375 G IVPB
3.3750 g | Freq: Three times a day (TID) | INTRAVENOUS | Status: DC
  Administered 2015-12-31 – 2016-01-02 (×6): 3.375 g via INTRAVENOUS
  Filled 2015-12-31 (×8): qty 50

## 2015-12-31 MED ORDER — HEPARIN (PORCINE) IN NACL 100-0.45 UNIT/ML-% IJ SOLN
1650.0000 [IU]/h | INTRAMUSCULAR | Status: DC
  Administered 2015-12-31: 1300 [IU]/h via INTRAVENOUS
  Administered 2016-01-01: 1450 [IU]/h via INTRAVENOUS
  Filled 2015-12-31 (×3): qty 250

## 2015-12-31 MED ORDER — ONDANSETRON HCL 4 MG/2ML IJ SOLN: 4.0000 mg | Freq: Four times a day (QID) | INTRAMUSCULAR | Status: DC | PRN

## 2015-12-31 MED ORDER — FENTANYL CITRATE (PF) 100 MCG/2ML IJ SOLN
INTRAMUSCULAR | Status: AC
  Filled 2015-12-31: qty 2

## 2015-12-31 MED ORDER — IOHEXOL 300 MG/ML  SOLN
INTRAMUSCULAR | Status: DC | PRN
Start: 2015-12-31 — End: 2015-12-31
  Administered 2015-12-31: 110 mL via INTRA_ARTERIAL

## 2015-12-31 MED ORDER — SODIUM CHLORIDE 0.9% FLUSH
3.0000 mL | INTRAVENOUS | Status: DC | PRN
  Administered 2016-01-01: 3 mL via INTRAVENOUS
  Filled 2015-12-31: qty 3

## 2015-12-31 MED ORDER — HEPARIN BOLUS VIA INFUSION
1150.0000 [IU] | Freq: Once | INTRAVENOUS | Status: AC
  Administered 2015-12-31: 1150 [IU] via INTRAVENOUS
  Filled 2015-12-31: qty 1150

## 2015-12-31 MED ORDER — FENTANYL CITRATE (PF) 100 MCG/2ML IJ SOLN
INTRAMUSCULAR | Status: DC | PRN
  Administered 2015-12-31: 50 ug via INTRAVENOUS

## 2015-12-31 MED ORDER — SODIUM CHLORIDE 0.9 % IV SOLN: 250.0000 mL | INTRAVENOUS | Status: DC | PRN

## 2015-12-31 MED ORDER — SODIUM CHLORIDE 0.9% FLUSH
3.0000 mL | Freq: Two times a day (BID) | INTRAVENOUS | Status: DC
  Administered 2015-12-31: 3 mL via INTRAVENOUS

## 2015-12-31 SURGICAL SUPPLY — 12 items
CATH INFINITI 5FR ANG PIGTAIL (CATHETERS) ×3 IMPLANT
CATH INFINITI 5FR JL4 (CATHETERS) ×3 IMPLANT
CATH INFINITI JR4 5F (CATHETERS) ×3 IMPLANT
CATH SWANZ 7F THERMO (CATHETERS) ×3 IMPLANT
DEVICE CLOSURE MYNXGRIP 5F (Vascular Products) ×3 IMPLANT
KIT MANI 3VAL PERCEP (MISCELLANEOUS) ×3 IMPLANT
KIT RIGHT HEART (MISCELLANEOUS) ×3 IMPLANT
NEEDLE PERC 18GX7CM (NEEDLE) ×3 IMPLANT
PACK CARDIAC CATH (CUSTOM PROCEDURE TRAY) ×3 IMPLANT
SHEATH PINNACLE 5F 10CM (SHEATH) ×3 IMPLANT
SHEATH PINNACLE 7F 10CM (SHEATH) ×3 IMPLANT
WIRE EMERALD 3MM-J .035X150CM (WIRE) ×3 IMPLANT

## 2015-12-31 NOTE — Progress Notes (Signed)
ANTIBIOTIC CONSULT NOTE - INITIAL  Pharmacy Consult for Levaquin Indication: possible HCAP  Allergies  Allergen Reactions  . Latex Dermatitis    Patient Measurements: Height:  (175.3 cm) Weight: 168 lb 1.6 oz (76.25 kg) IBW/kg (Calculated) : 66.2 Adjusted Body Weight: na  Vital Signs: Temp: 98.9 F (37.2 C) (01/30 1007) Temp Source: Oral (01/30 1007) BP: 102/68 mmHg (01/30 1007) Pulse Rate: 89 (01/30 1007) Intake/Output from previous day: 01/29 0701 - 01/30 0700 In: 363 [P.O.:360; I.V.:3] Out: 1750 [Urine:1750] Intake/Output from this shift: Total I/O In: 3 [I.V.:3] Out: -   Labs:  Recent Labs  12/29/15 0447 12/30/15 0658 12/31/15 0509  WBC  --   --  7.9  HGB  --   --  13.3  PLT  --   --  412  CREATININE 0.84 1.05* 1.11*   Estimated Creatinine Clearance: 57.7 mL/min (by C-G formula based on Cr of 1.11). No results for input(s): VANCOTROUGH, VANCOPEAK, VANCORANDOM, GENTTROUGH, GENTPEAK, GENTRANDOM, TOBRATROUGH, TOBRAPEAK, TOBRARND, AMIKACINPEAK, AMIKACINTROU, AMIKACIN in the last 72 hours.   Microbiology: No results found for this or any previous visit (from the past 720 hour(s)).  Medical History: Past Medical History  Diagnosis Date  . Depression   . DDD (degenerative disc disease), cervical   . Anxiety   . Fibromyalgia     Medications:  Scheduled:  . aspirin EC  81 mg Oral Daily  . furosemide  40 mg Intravenous BID  . Influenza vac split quadrivalent PF  0.5 mL Intramuscular Tomorrow-1000  . [START ON 01/01/2016] levofloxacin (LEVAQUIN) IV  750 mg Intravenous Q24H  . losartan  25 mg Oral Daily  . methocarbamol  750 mg Oral QHS  . metoprolol succinate  12.5 mg Oral Daily  . piperacillin-tazobactam  3.375 g Intravenous 3 times per day  . potassium chloride  40 mEq Oral Daily  . sodium chloride flush  3 mL Intravenous Q12H  . sodium chloride flush  3 mL Intravenous Q12H  . sodium chloride flush  3 mL Intravenous Q12H  . venlafaxine XR  150 mg  Oral Q breakfast   Assessment: Patient admitted for CHF. Now with possible HCAP. Pharmacy consulted to dose Levaquin for same. Noted that MD has also started patient on Zosyn 3.375g IV q8h EI.  Goal of Therapy:  Resolution of symptoms  Plan:  Follow up culture results Will order Levaquin  IV q24h for suspected HCAP. Will follow up with MD on rounds about need for both Zosyn and Levaquin.  Clovia Cuff, PharmD, BCPS 12/31/2015 11:49 AM

## 2015-12-31 NOTE — Progress Notes (Signed)
EKG as ordered done.  Dr. Gwen Pounds in attendance at bedside to check patient.  Report was given to tanya. Check right groin for bleeding or hematoma.  Patient will be on bedrest for 2 hours post sheath pull---out of bed at_0910.    Bilateral pulses are 1's DP's.  Patient without pain and stable vital signs at the current time

## 2015-12-31 NOTE — Progress Notes (Signed)
NURSE tanya notified that right antecubital old IV site looks red and warm to the touch---need to notify Dr today.

## 2015-12-31 NOTE — Progress Notes (Signed)
ANTICOAGULATION CONSULT NOTE - Initial Consult  Pharmacy Consult for Heparin drip Indication: pulmonary embolus and DVT  Allergies  Allergen Reactions  . Latex Dermatitis    Patient Measurements: Height:  (175.3 cm) Weight: 168 lb 1.6 oz (76.25 kg) IBW/kg (Calculated) : 66.2 Heparin Dosing Weight: 76.3 kg  Vital Signs: Temp: 98.9 F (37.2 C) (01/30 1007) Temp Source: Oral (01/30 1007) BP: 102/68 mmHg (01/30 1007) Pulse Rate: 89 (01/30 1007)  Labs:  Recent Labs  12/29/15 0447 12/30/15 0658 12/31/15 0509  HGB  --   --  13.3  HCT  --   --  41.4  PLT  --   --  412  LABPROT  --   --  15.5*  INR  --   --  1.21  CREATININE 0.84 1.05* 1.11*    Estimated Creatinine Clearance: 57.7 mL/min (by C-G formula based on Cr of 1.11).   Medical History: Past Medical History  Diagnosis Date  . Depression   . DDD (degenerative disc disease), cervical   . Anxiety   . Fibromyalgia     Medications:  Infusions:  . sodium chloride 3 mL/kg/hr (12/31/15 0900)  . heparin      Assessment: Patient is a 60yo female admitted for CHF. Now with SOB and chest discomfort. CT shows possible PE/DVT. Pharmacy consulted to dose Heparin infusion without a bolus.  1/30 Hgb=13.3, Plt=412, INR=1.21  Goal of Therapy:  Heparin level 0.3-0.7 units/ml Monitor platelets by anticoagulation protocol: Yes   Plan:  Start heparin infusion at 1300 units/hr Check anti-Xa level in 6 hours and daily while on heparin Continue to monitor H&H and platelets  Will discontinue Lovenox  SQ q24h.  Clovia Cuff, PharmD, BCPS 12/31/2015 11:46 AM

## 2015-12-31 NOTE — Progress Notes (Signed)
On arrival to holding. Patient crying out with pain in right chest below breast that radiates to back  8:10.  Dr. Gwen Pounds in ateendance  BP goes to 80's 70"s  250 bolus given as ordered.  Patient to CT scan as ordered.  Patient remains in intense grabbing pain.  Kowalski in attendance.

## 2015-12-31 NOTE — Progress Notes (Signed)
[facility] [place] at [facility]   PATIENT NAME: Maureen Grant    MR#:  [id number]  DATE OF BIRTH:  1956/09/05  SUBJECTIVE:  CHIEF COMPLAINT:   Chief Complaint  Patient presents with  . Shortness of Breath   patient is a 60 year old female who presents to the hospital with complaints of shortness of breath, orthopnea, cough of some right-sided chest pains.  Today patient was seen and examined in the catheter lab after cardiac catheterization. Patient was reporting right-sided chest pain following cardiac catheterization. Still reporting cough  Review of Systems  Constitutional: Negative for fever, chills and weight loss.  HENT: Negative for congestion.   Eyes: Negative for blurred vision and double vision.  Respiratory: Positive for cough and shortness of breath. Negative for sputum production and wheezing.   Cardiovascular: Negative for chest pain, palpitations, orthopnea, leg swelling and PND.  Gastrointestinal: Negative for nausea, vomiting, abdominal pain, diarrhea, constipation and blood in stool.  Genitourinary: Negative for dysuria, urgency, frequency and hematuria.  Musculoskeletal: Negative for falls.  Neurological: Negative for dizziness, tremors, focal weakness and headaches.  Endo/Heme/Allergies: Does not bruise/bleed easily.  Psychiatric/Behavioral: Negative for depression. The patient does not have insomnia.     VITAL SIGNS: Blood pressure 102/68, pulse 89, temperature 98.9 F (37.2 C), temperature source Oral, resp. rate 18, height  (1.753 m), weight 76.25 kg (168 lb 1.6 oz), SpO2 96 %.  PHYSICAL EXAMINATION:   GENERAL:  60 y.o.-year-old patient sitting in the bed with no acute distress.  EYES: Pupils equal, round, reactive to light and accommodation. No scleral icterus. Extraocular muscles intact.  HEENT: Head atraumatic, normocephalic. Oropharynx and nasopharynx clear.  NECK:  Supple, no jugular venous distention. No thyroid  enlargement, no tenderness.  LUNGS: Diminished breath sounds on the left, primarily at base, no wheezing, few rales,rhonchi , no crepitation. Not using accessory muscles of respiration, able to speak in longer sentences.  CARDIOVASCULAR: S1, S2 normal. No murmurs, rubs, or gallops. Right anterior chest wall tenderness is present ABDOMEN: Soft, nontender, nondistended. Bowel sounds present. No organomegaly or mass.  EXTREMITIES: No pedal edema, cyanosis, or clubbing.  NEUROLOGIC: Cranial nerves II through XII are intact. Muscle strength 5/5 in all extremities. Sensation intact. Gait not checked.  PSYCHIATRIC: The patient is alert and oriented x 3.  SKIN: No obvious rash, lesion, or ulcer.   ORDERS/RESULTS REVIEWED:   CBC  Recent Labs Lab 12/27/15 0857 12/28/15 0426 12/31/15 0509  WBC 13.2* 10.5 7.9  HGB 12.8 13.0 13.3  HCT 40.5 40.4 41.4  PLT 372 356 412  MCV 86.9 85.8 84.4  MCH 27.6 27.7 27.2  MCHC 31.7* 32.3 32.2  RDW 15.3* 15.0* 14.7*  LYMPHSABS 1.4  --   --   MONOABS 1.1*  --   --   EOSABS 0.0  --   --   BASOSABS 0.0  --   --    ------------------------------------------------------------------------------------------------------------------  Chemistries   Recent Labs Lab 12/27/15 0857 12/28/15 0426 12/29/15 0447 12/30/15 0658 12/31/15 0509  NA 137 140 138 136 137  K 3.2* 4.8 3.5 5.0 4.8  CL 99* 99* 100* 98* 98*  CO2 32 33*  GLUCOSE 133* 105* 95 101* 104*  BUN CREATININE 1.05* 1.01* 0.84 1.05* 1.11*  CALCIUM 8.7* 8.8* 8.4* 8.6* 8.6*  MG  --   --   --  1.9  --    ------------------------------------------------------------------------------------------------------------------ estimated creatinine clearance is 57.7 mL/min (by C-G  formula based on Cr of 1.11). ------------------------------------------------------------------------------------------------------------------ No results for input(s): TSH, T4TOTAL, T3FREE, THYROIDAB in the  last 72 hours.  Invalid input(s): FREET3  Cardiac Enzymes  Recent Labs Lab 12/27/15 0857 12/27/15 1917 12/28/15 0105  TROPONINI 0.14* 0.15* 0.12*   ------------------------------------------------------------------------------------------------------------------ Invalid input(s): POCBNP ---------------------------------------------------------------------------------------------------------------  RADIOLOGY: Ct Abdomen Wo Contrast  12/31/2015  CLINICAL DATA:  Left chest and abdominal pain status post cardiac catheterization. CHF. EXAM: CT CHEST AND ABDOMEN WITHOUT CONTRAST TECHNIQUE: Multidetector CT imaging of the chest and abdomen was performed following the standard protocol without IV contrast. COMPARISON:  12/20/2015 chest CT . FINDINGS: CT CHEST Mediastinum/Nodes: Stable mild cardiomegaly. Minimal pericardial fluid/ thickening, slightly increased. Great vessels are normal in course and caliber. Stable hypodense 1.1 cm anterior right thyroid lobe nodule and 1.0 cm coarsely calcified anterior left thyroid lobe nodule. Normal esophagus. No pathologically enlarged axillary, mediastinal or gross hilar lymph nodes, noting limited sensitivity for the detection of hilar adenopathy on this noncontrast study. There is a new central low-density filling defect within the right internal jugular vein, suspicious for acute deep venous thrombosis. Lungs/Pleura: No pneumothorax. Small layering right pleural effusion, decreased since 12/19/2015. Small layering left pleural effusion, increased since 12/20/2015. There are new wedge-shaped subpleural focus of patchy consolidation with surrounding ground-glass opacity in the anterior right lower lobe (series 4/ image 43) and basilar right lower lobe (series 4/ image 59). There is new patchy consolidation and ground-glass opacity in the lingula and left lower lobe, with components of passive atelectasis in the lingula and left lower lobe. Mild interlobular septal  thickening in the upper lobes likely represents mild pulmonary edema. Musculoskeletal: No aggressive appearing focal osseous lesions. Marked degenerative changes in the thoracic spine. CT ABDOMEN Hepatobiliary: There is a 1.0 cm simple liver cyst in the segment 4B left liver lobe. There are at least 3 additional scattered subcentimeter hypodense lesions throughout the liver, too small to characterize. There is a subcapsular focus of low-density in the anterior medial segment left liver lobe adjacent to the intersegmental fissure, favor focal fat. Normal gallbladder with no radiopaque cholelithiasis. No biliary ductal dilatation. Pancreas: Normal, with no mass or duct dilation. Spleen: Normal size. No mass. Adrenals/Urinary Tract: Normal adrenals. There is symmetric excreted contrast in the bilateral renal collecting systems. No hydronephrosis. No renal mass. Stomach/Bowel: Tiny hiatal hernia. Otherwise collapsed and grossly normal stomach. Visualized small and large bowel is normal caliber, with no bowel wall thickening. Vascular/Lymphatic: Normal caliber abdominal aorta. No pathologically enlarged lymph nodes in the abdomen. Other: No pneumoperitoneum, ascites or focal fluid collection. No evidence of a retroperitoneal hematoma. Musculoskeletal: No aggressive appearing focal osseous lesions. Marked degenerative changes in the lumbar spine. IMPRESSION: 1. New central low-density filling defect in the right internal jugular vein, suspicious for acute deep venous thrombosis. Recommend correlation with right upper extremity venous Doppler scan. 2. New wedge shaped subpleural foci of patchy consolidation with surrounding ground-glass opacity in the right lower lobe. New patchy consolidation and ground-glass opacity in the lingula and left lower lobe. Findings are nonspecific and could represent multifocal pneumonia, however the possibility of pulmonary infarcts due to pulmonary embolism is not excluded. Correlate with  chest CT angiography or V/Q scan as indicated. 3. Small left greater than right layering bilateral pleural effusions, decreased on the right and increased on the left, with associated passive lower lobe atelectasis. 4. Stable findings of mild congestive heart failure including mild cardiomegaly and mild pulmonary edema. 5. Minimal pericardial fluid/thickening, slightly increased. 6. No acute abnormality  in the abdomen. No evidence of a retroperitoneal hematoma. Tiny hiatal hernia. These results will be called to the ordering clinician or representative by the Radiologist Assistant, and communication documented in the PACS or zVision Dashboard. Electronically Signed   By: Delbert Phenix M.D.   On: 12/31/2015 09:17   Dg Chest 2 View  12/31/2015  CLINICAL DATA:  Pt having a VQ lung scan today. She is also having right sided chest pain today. Hx of fibromyalgia. Former smoker; quit 2014 EXAM: CHEST  2 VIEW COMPARISON:  Chest CT 12/31/2015, chest x-ray 12/27/2015 FINDINGS: Heart margins are obscured by bibasilar opacity. There are bilateral pleural effusions. Left lung base opacity consistent with atelectasis or infiltrate and appear stable. No evidence for pulmonary edema. Biapical pleural parenchymal thickening appears stable. IMPRESSION: Persistent significant opacity at the left lung base. Bilateral pleural effusions, left greater than right. Electronically Signed   By: Norva Pavlov M.D.   On: 12/31/2015 13:12   Ct Chest Without Contrast  12/31/2015  CLINICAL DATA:  Left chest and abdominal pain status post cardiac catheterization. CHF. EXAM: CT CHEST AND ABDOMEN WITHOUT CONTRAST TECHNIQUE: Multidetector CT imaging of the chest and abdomen was performed following the standard protocol without IV contrast. COMPARISON:  12/20/2015 chest CT . FINDINGS: CT CHEST Mediastinum/Nodes: Stable mild cardiomegaly. Minimal pericardial fluid/ thickening, slightly increased. Great vessels are normal in course and caliber.  Stable hypodense 1.1 cm anterior right thyroid lobe nodule and 1.0 cm coarsely calcified anterior left thyroid lobe nodule. Normal esophagus. No pathologically enlarged axillary, mediastinal or gross hilar lymph nodes, noting limited sensitivity for the detection of hilar adenopathy on this noncontrast study. There is a new central low-density filling defect within the right internal jugular vein, suspicious for acute deep venous thrombosis. Lungs/Pleura: No pneumothorax. Small layering right pleural effusion, decreased since 12/19/2015. Small layering left pleural effusion, increased since 12/20/2015. There are new wedge-shaped subpleural focus of patchy consolidation with surrounding ground-glass opacity in the anterior right lower lobe (series 4/ image 43) and basilar right lower lobe (series 4/ image 59). There is new patchy consolidation and ground-glass opacity in the lingula and left lower lobe, with components of passive atelectasis in the lingula and left lower lobe. Mild interlobular septal thickening in the upper lobes likely represents mild pulmonary edema. Musculoskeletal: No aggressive appearing focal osseous lesions. Marked degenerative changes in the thoracic spine. CT ABDOMEN Hepatobiliary: There is a 1.0 cm simple liver cyst in the segment 4B left liver lobe. There are at least 3 additional scattered subcentimeter hypodense lesions throughout the liver, too small to characterize. There is a subcapsular focus of low-density in the anterior medial segment left liver lobe adjacent to the intersegmental fissure, favor focal fat. Normal gallbladder with no radiopaque cholelithiasis. No biliary ductal dilatation. Pancreas: Normal, with no mass or duct dilation. Spleen: Normal size. No mass. Adrenals/Urinary Tract: Normal adrenals. There is symmetric excreted contrast in the bilateral renal collecting systems. No hydronephrosis. No renal mass. Stomach/Bowel: Tiny hiatal hernia. Otherwise collapsed and  grossly normal stomach. Visualized small and large bowel is normal caliber, with no bowel wall thickening. Vascular/Lymphatic: Normal caliber abdominal aorta. No pathologically enlarged lymph nodes in the abdomen. Other: No pneumoperitoneum, ascites or focal fluid collection. No evidence of a retroperitoneal hematoma. Musculoskeletal: No aggressive appearing focal osseous lesions. Marked degenerative changes in the lumbar spine. IMPRESSION: 1. New central low-density filling defect in the right internal jugular vein, suspicious for acute deep venous thrombosis. Recommend correlation with right upper extremity venous  Doppler scan. 2. New wedge shaped subpleural foci of patchy consolidation with surrounding ground-glass opacity in the right lower lobe. New patchy consolidation and ground-glass opacity in the lingula and left lower lobe. Findings are nonspecific and could represent multifocal pneumonia, however the possibility of pulmonary infarcts due to pulmonary embolism is not excluded. Correlate with chest CT angiography or V/Q scan as indicated. 3. Small left greater than right layering bilateral pleural effusions, decreased on the right and increased on the left, with associated passive lower lobe atelectasis. 4. Stable findings of mild congestive heart failure including mild cardiomegaly and mild pulmonary edema. 5. Minimal pericardial fluid/thickening, slightly increased. 6. No acute abnormality in the abdomen. No evidence of a retroperitoneal hematoma. Tiny hiatal hernia. These results will be called to the ordering clinician or representative by the Radiologist Assistant, and communication documented in the PACS or zVision Dashboard. Electronically Signed   By: Delbert Phenix M.D.   On: 12/31/2015 09:17   Nm Pulmonary Perf And Vent  12/31/2015  CLINICAL DATA:  Chest pain at rest. EXAM: NUCLEAR MEDICINE VENTILATION - PERFUSION LUNG SCAN TECHNIQUE: Ventilation images were obtained in multiple projections  using inhaled aerosol Tc-66m DTPA. Perfusion images were obtained in multiple projections after intravenous injection of Tc-39m MAA. RADIOPHARMACEUTICALS:  32.46 millicuries of Technetium-28m DTPA aerosol inhalation and 3.92 millicuries of Technetium-4m MAA IV COMPARISON:  Chest x-ray dated 12/31/2015 FINDINGS: Ventilation: There is decreased perfusion at the left lung base due to the large effusion seen on chest x-ray. The ventilation exam is otherwise normal. Perfusion: There is slightly decreased profusion at the left lung base due to the effusion. There is a wedge-shaped perfusion defect at the right lung base which is felt to be due to fluid along the major fissure due to the small right effusion. IMPRESSION: No findings suggestive of pulmonary embolism. Bilateral pleural effusions, left more than right. Electronically Signed   By: Francene Boyers M.D.   On: 12/31/2015 14:44   US Venous Img Upper Uni Right  12/31/2015  CLINICAL DATA:  DVT. EXAM: RIGHT UPPER EXTREMITY VENOUS DOPPLER ULTRASOUND TECHNIQUE: Gray-scale sonography with graded compression, as well as color Doppler and duplex ultrasound were performed to evaluate the upper extremity deep venous system from the level of the subclavian vein and including the jugular, axillary, basilic, radial, ulnar and upper cephalic vein. Spectral Doppler was utilized to evaluate flow at rest and with distal augmentation maneuvers. COMPARISON:  CT 12/31/2015 FINDINGS: Contralateral Subclavian Vein: Respiratory phasicity is normal and symmetric with the symptomatic side. No evidence of thrombus. Normal compressibility. Internal Jugular Vein: Occlusive noncompressible thrombus. Subclavian Vein: Occlusive noncompressible thrombus. Axillary Vein: No evidence of thrombus. Normal compressibility, respiratory phasicity and response to augmentation. Cephalic Vein: No evidence of thrombus. Normal compressibility, respiratory phasicity and response to augmentation. Basilic  Vein: Near occlusive thrombus with decreased compressibility. Brachial Veins: No evidence of thrombus. Normal compressibility, respiratory phasicity and response to augmentation. Radial Veins: No evidence of thrombus. Normal compressibility, respiratory phasicity and response to augmentation. Ulnar Veins: No evidence of thrombus. Normal compressibility, respiratory phasicity and response to augmentation. Other Findings:  None visualized. IMPRESSION: Positive study for occlusive thrombosis of the right internal jugular vein and right subclavian vein . Near occlusive thrombus is noted in the basilic vein. These results will be called to the ordering clinician or representative by the Radiologist Assistant, and communication documented in the PACS or zVision Dashboard. Electronically Signed   By: Maisie Fus  Register   On: 12/31/2015 12:05  EKG:  Orders placed or performed during the hospital encounter of 12/27/15  . ED EKG  . ED EKG  . EKG 12-Lead  . EKG 12-Lead  . ED EKG  . ED EKG  . EKG 12-Lead  . EKG 12-Lead    ASSESSMENT AND PLAN:  Active Problems:   CHF (congestive heart failure) (HCC)  # right-sided chest pain probably from pulmonary embolism and right IJ DVT Patient is started on heparin drip without bolus as patient just had cardiac catheterization done. Right upper extremity venous Dopplers with right IJ venous thrombosis and right subclavian vein. Near occlusive thrombus in right basilic vein CAT scan of the chest with wedge-shaped defects suspicious for pulmonary embolism. VQ scan is ordered which is pending at this time. Patient is started on heparin drip without bolus after discussing with Dr. 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 Continue metoprolol and add ARB to her regimen  #. Renal insufficiency, stable, following closely with diuresis  #. 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 Elevated troponin, Myoview stress-12/28/15 has revealed multivessel coronary artery disease  #Persistent nausea-Zofran as needed and PPI   Management plans discussed with the patient, family and they are in agreement.   DRUG ALLERGIES:  Allergies  Allergen Reactions  . Latex Dermatitis    CODE STATUS:     Code Status Orders        Start     Ordered   12/27/15 1036  Full code   Continuous     12/27/15 1036    Code Status History    Date Active Date Inactive Code Status Order ID Comments User Context   This patient has a current code status but no historical code status.      TOTAL critical care TIME TAKING CARE OF THIS PATIENT: 45 minutes.    Ramonita Lab M.D on 12/31/2015 at 2:53 PM  Between 7am to 6pm - Pager - [phone]  After 6pm go to [email or web address] - password [email or web address] [facility]  [facility]  Office  [phone]  CC: Primary care physician; Margaretann Loveless, PA-C

## 2015-12-31 NOTE — Progress Notes (Signed)
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 rate 18, height  (1.753 m), weight 168 lb 1.6 oz (76.25 kg), SpO2 96 %. Body mass index is 24.81 kg/(m^2). General: Well developed, well nourished, in no acute distress. Head: Normocephalic, atraumatic, sclera non-icteric, no xanthomas, nares are without discharge. Neck: No apparent masses Lungs: Normal respirations with few wheezes, no rhonchi, no rales , some basilar crackles   Heart: Regular rate and rhythm, normal S1 S2, no murmur, no rub, no gallop, PMI is normal size and placement, carotid upstroke normal without bruit, jugular venous pressure normal Abdomen: Soft, non-tender, non-distended with normoactive bowel sounds. No hepatosplenomegaly. Abdominal aorta is normal size without bruit Extremities: No edema, no clubbing, no cyanosis, no ulcers,  Peripheral: 2+ radial, 2+ femoral, 2+ dorsal pedal pulses Neuro: Alert and oriented. Moves all extremities spontaneously. Psych:  Responds to questions appropriately with a normal affect.   Intake/Output Summary (Last 24 hours) at 12/31/15 1657 Last data  filed at 12/31/15 1653  Gross per 24 hour  Intake   1068 ml  Output    950 ml  Net    118 ml    Inpatient Medications:  . aspirin EC  81 mg Oral Daily  . furosemide  40 mg Intravenous BID  . Influenza vac split quadrivalent PF  0.5 mL Intramuscular Tomorrow-1000  . [START ON 01/01/2016] levofloxacin (LEVAQUIN) IV  750 mg Intravenous Q24H  . losartan  25 mg Oral Daily  . methocarbamol  750 mg Oral QHS  . metoprolol succinate  12.5 mg Oral Daily  . piperacillin-tazobactam  3.375 g Intravenous 3 times per day  . potassium chloride  40 mEq Oral Daily  . sodium chloride flush  3 mL Intravenous Q12H  . sodium chloride flush  3 mL Intravenous Q12H  . sodium chloride flush  3 mL Intravenous Q12H  . venlafaxine XR  150 mg Oral Q breakfast   Infusions:  . heparin 1,300 Units/hr (12/31/15 1237)    Labs:  Recent Labs  12/30/15 0658 12/31/15 0509  NA 136 137  K 5.0 4.8  CL 98* 98*  CO2 32 33*  GLUCOSE 101* 104*  BUN 16 16  CREATININE 1.05* 1.11*  CALCIUM 8.6* 8.6*  MG 1.9  --    No results for input(s): AST, ALT, ALKPHOS, BILITOT, PROT, ALBUMIN in the last 72 hours.  Recent Labs  12/31/15 0509  WBC 7.9  HGB 13.3  HCT 41.4  MCV 84.4  PLT 412   No results for input(s): CKTOTAL, CKMB, TROPONINI in the last 72 hours. Invalid input(s): POCBNP No results for input(s): HGBA1C in the last 72 hours.   Weights: Filed Weights   12/29/15 0506 12/30/15 0459 12/31/15 0524  Weight: 172 lb 3.2 oz (78.109 kg) 169 lb 6.4 oz (  76.839 kg) 168 lb 1.6 oz (76.25 kg)     Radiology/Studies:  Ct Abdomen Wo Contrast  12/31/2015  CLINICAL DATA:  Left chest and abdominal pain status post cardiac catheterization. CHF. EXAM: CT CHEST AND ABDOMEN WITHOUT CONTRAST TECHNIQUE: Multidetector CT imaging of the chest and abdomen was performed following the standard protocol without IV contrast. COMPARISON:  12/20/2015 chest CT . FINDINGS: CT CHEST Mediastinum/Nodes: Stable mild cardiomegaly. Minimal  pericardial fluid/ thickening, slightly increased. Great vessels are normal in course and caliber. Stable hypodense 1.1 cm anterior right thyroid lobe nodule and 1.0 cm coarsely calcified anterior left thyroid lobe nodule. Normal esophagus. No pathologically enlarged axillary, mediastinal or gross hilar lymph nodes, noting limited sensitivity for the detection of hilar adenopathy on this noncontrast study. There is a new central low-density filling defect within the right internal jugular vein, suspicious for acute deep venous thrombosis. Lungs/Pleura: No pneumothorax. Small layering right pleural effusion, decreased since 12/19/2015. Small layering left pleural effusion, increased since 12/20/2015. There are new wedge-shaped subpleural focus of patchy consolidation with surrounding ground-glass opacity in the anterior right lower lobe (series 4/ image 43) and basilar right lower lobe (series 4/ image 59). There is new patchy consolidation and ground-glass opacity in the lingula and left lower lobe, with components of passive atelectasis in the lingula and left lower lobe. Mild interlobular septal thickening in the upper lobes likely represents mild pulmonary edema. Musculoskeletal: No aggressive appearing focal osseous lesions. Marked degenerative changes in the thoracic spine. CT ABDOMEN Hepatobiliary: There is a 1.0 cm simple liver cyst in the segment 4B left liver lobe. There are at least 3 additional scattered subcentimeter hypodense lesions throughout the liver, too small to characterize. There is a subcapsular focus of low-density in the anterior medial segment left liver lobe adjacent to the intersegmental fissure, favor focal fat. Normal gallbladder with no radiopaque cholelithiasis. No biliary ductal dilatation. Pancreas: Normal, with no mass or duct dilation. Spleen: Normal size. No mass. Adrenals/Urinary Tract: Normal adrenals. There is symmetric excreted contrast in the bilateral renal collecting  systems. No hydronephrosis. No renal mass. Stomach/Bowel: Tiny hiatal hernia. Otherwise collapsed and grossly normal stomach. Visualized small and large bowel is normal caliber, with no bowel wall thickening. Vascular/Lymphatic: Normal caliber abdominal aorta. No pathologically enlarged lymph nodes in the abdomen. Other: No pneumoperitoneum, ascites or focal fluid collection. No evidence of a retroperitoneal hematoma. Musculoskeletal: No aggressive appearing focal osseous lesions. Marked degenerative changes in the lumbar spine. IMPRESSION: 1. New central low-density filling defect in the right internal jugular vein, suspicious for acute deep venous thrombosis. Recommend correlation with right upper extremity venous Doppler scan. 2. New wedge shaped subpleural foci of patchy consolidation with surrounding ground-glass opacity in the right lower lobe. New patchy consolidation and ground-glass opacity in the lingula and left lower lobe. Findings are nonspecific and could represent multifocal pneumonia, however the possibility of pulmonary infarcts due to pulmonary embolism is not excluded. Correlate with chest CT angiography or V/Q scan as indicated. 3. Small left greater than right layering bilateral pleural effusions, decreased on the right and increased on the left, with associated passive lower lobe atelectasis. 4. Stable findings of mild congestive heart failure including mild cardiomegaly and mild pulmonary edema. 5. Minimal pericardial fluid/thickening, slightly increased. 6. No acute abnormality in the abdomen. No evidence of a retroperitoneal hematoma. Tiny hiatal hernia. These results will be called to the ordering clinician or representative by the Radiologist Assistant, and communication documented in the PACS or zVision Dashboard. Electronically Signed  By: Delbert Phenix M.D.   On: 12/31/2015 09:17   Dg Chest 2 View  12/31/2015  CLINICAL DATA:  Pt having a VQ lung scan today. She is also having right  sided chest pain today. Hx of fibromyalgia. Former smoker; quit 2014 EXAM: CHEST  2 VIEW COMPARISON:  Chest CT 12/31/2015, chest x-ray 12/27/2015 FINDINGS: Heart margins are obscured by bibasilar opacity. There are bilateral pleural effusions. Left lung base opacity consistent with atelectasis or infiltrate and appear stable. No evidence for pulmonary edema. Biapical pleural parenchymal thickening appears stable. IMPRESSION: Persistent significant opacity at the left lung base. Bilateral pleural effusions, left greater than right. Electronically Signed   By: Norva Pavlov M.D.   On: 12/31/2015 13:12   Dg Chest 2 View  12/27/2015  CLINICAL DATA:  Shortness of breath and chest pain for 2 months EXAM: CHEST  2 VIEW COMPARISON:  Chest CT December 20, 2015 FINDINGS: There is a sizable left pleural effusion with atelectasis/ consolidation in portions of the lingula and left lower lobe. There is a minimal right effusion. The right lung is otherwise clear. Heart is upper normal in size with pulmonary vascular within normal limits. No adenopathy. No bone lesions. IMPRESSION: Sizable left pleural effusion with atelectasis/ consolidation in portions of the lingula and left lower lobe. There is increased opacity in this area compared to recent CT. Minimal right effusion. Right lung otherwise clear. No change in cardiac silhouette. Electronically Signed   By: Bretta Bang III M.D.   On: 12/27/2015 09:14   Ct Chest Without Contrast  12/31/2015  CLINICAL DATA:  Left chest and abdominal pain status post cardiac catheterization. CHF. EXAM: CT CHEST AND ABDOMEN WITHOUT CONTRAST TECHNIQUE: Multidetector CT imaging of the chest and abdomen was performed following the standard protocol without IV contrast. COMPARISON:  12/20/2015 chest CT . FINDINGS: CT CHEST Mediastinum/Nodes: Stable mild cardiomegaly. Minimal pericardial fluid/ thickening, slightly increased. Great vessels are normal in course and caliber. Stable  hypodense 1.1 cm anterior right thyroid lobe nodule and 1.0 cm coarsely calcified anterior left thyroid lobe nodule. Normal esophagus. No pathologically enlarged axillary, mediastinal or gross hilar lymph nodes, noting limited sensitivity for the detection of hilar adenopathy on this noncontrast study. There is a new central low-density filling defect within the right internal jugular vein, suspicious for acute deep venous thrombosis. Lungs/Pleura: No pneumothorax. Small layering right pleural effusion, decreased since 12/19/2015. Small layering left pleural effusion, increased since 12/20/2015. There are new wedge-shaped subpleural focus of patchy consolidation with surrounding ground-glass opacity in the anterior right lower lobe (series 4/ image 43) and basilar right lower lobe (series 4/ image 59). There is new patchy consolidation and ground-glass opacity in the lingula and left lower lobe, with components of passive atelectasis in the lingula and left lower lobe. Mild interlobular septal thickening in the upper lobes likely represents mild pulmonary edema. Musculoskeletal: No aggressive appearing focal osseous lesions. Marked degenerative changes in the thoracic spine. CT ABDOMEN Hepatobiliary: There is a 1.0 cm simple liver cyst in the segment 4B left liver lobe. There are at least 3 additional scattered subcentimeter hypodense lesions throughout the liver, too small to characterize. There is a subcapsular focus of low-density in the anterior medial segment left liver lobe adjacent to the intersegmental fissure, favor focal fat. Normal gallbladder with no radiopaque cholelithiasis. No biliary ductal dilatation. Pancreas: Normal, with no mass or duct dilation. Spleen: Normal size. No mass. Adrenals/Urinary Tract: Normal adrenals. There is symmetric excreted contrast in the bilateral renal  collecting systems. No hydronephrosis. No renal mass. Stomach/Bowel: Tiny hiatal hernia. Otherwise collapsed and grossly  normal stomach. Visualized small and large bowel is normal caliber, with no bowel wall thickening. Vascular/Lymphatic: Normal caliber abdominal aorta. No pathologically enlarged lymph nodes in the abdomen. Other: No pneumoperitoneum, ascites or focal fluid collection. No evidence of a retroperitoneal hematoma. Musculoskeletal: No aggressive appearing focal osseous lesions. Marked degenerative changes in the lumbar spine. IMPRESSION: 1. New central low-density filling defect in the right internal jugular vein, suspicious for acute deep venous thrombosis. Recommend correlation with right upper extremity venous Doppler scan. 2. New wedge shaped subpleural foci of patchy consolidation with surrounding ground-glass opacity in the right lower lobe. New patchy consolidation and ground-glass opacity in the lingula and left lower lobe. Findings are nonspecific and could represent multifocal pneumonia, however the possibility of pulmonary infarcts due to pulmonary embolism is not excluded. Correlate with chest CT angiography or V/Q scan as indicated. 3. Small left greater than right layering bilateral pleural effusions, decreased on the right and increased on the left, with associated passive lower lobe atelectasis. 4. Stable findings of mild congestive heart failure including mild cardiomegaly and mild pulmonary edema. 5. Minimal pericardial fluid/thickening, slightly increased. 6. No acute abnormality in the abdomen. No evidence of a retroperitoneal hematoma. Tiny hiatal hernia. These results will be called to the ordering clinician or representative by the Radiologist Assistant, and communication documented in the PACS or zVision Dashboard. Electronically Signed   By: Delbert Phenix M.D.   On: 12/31/2015 09:17   Ct Chest Wo Contrast  12/20/2015  CLINICAL DATA:  Coursing short of breath.  Cough 1/2 weeks. EXAM: CT CHEST WITHOUT CONTRAST TECHNIQUE: Multidetector CT imaging of the chest was performed following the standard  protocol without IV contrast. COMPARISON:  None. FINDINGS: Mediastinum/Nodes: No axillary supraclavicular adenopathy. Mild thyroid nodularity with calcification. No mediastinal hilar or adenopathy. No pericardial fluid. Heart appears enlarged. Esophagus is normal. Lungs/Pleura: Bilateral moderate pleural effusions. No pulmonary edema or interlobular septal thickening mild basilar atelectasis. Airways are normal. Upper abdomen: Several low-attenuation lesions within the liver are likely benign but cannot be fully characterized (image 68 series 2) focal fatty infiltration along the falciform ligament. Normal adrenal glands. Musculoskeletal: Degenerate spurring of spine at multiple levels. Endplate narrowing. IMPRESSION: 1. Cardiomegaly and bilateral pleural effusions suggest mild heart failure. 2. No edema or pulmonary infection. Electronically Signed   By: Genevive Bi M.D.   On: 12/20/2015 10:25   Nm Pulmonary Perf And Vent  12/31/2015  CLINICAL DATA:  Chest pain at rest. EXAM: NUCLEAR MEDICINE VENTILATION - PERFUSION LUNG SCAN TECHNIQUE: Ventilation images were obtained in multiple projections using inhaled aerosol Tc-66m DTPA. Perfusion images were obtained in multiple projections after intravenous injection of Tc-44m MAA. RADIOPHARMACEUTICALS:  32.46 millicuries of Technetium-38m DTPA aerosol inhalation and 3.92 millicuries of Technetium-31m MAA IV COMPARISON:  Chest x-ray dated 12/31/2015 FINDINGS: Ventilation: There is decreased perfusion at the left lung base due to the large effusion seen on chest x-ray. The ventilation exam is otherwise normal. Perfusion: There is slightly decreased profusion at the left lung base due to the effusion. There is a wedge-shaped perfusion defect at the right lung base which is felt to be due to fluid along the major fissure due to the small right effusion. IMPRESSION: No findings suggestive of pulmonary embolism. Bilateral pleural effusions, left more than right.  Electronically Signed   By: Francene Boyers M.D.   On: 12/31/2015 14:44   US Venous Img Upper Uni  Right  12/31/2015  CLINICAL DATA:  DVT. EXAM: RIGHT UPPER EXTREMITY VENOUS DOPPLER ULTRASOUND TECHNIQUE: Gray-scale sonography with graded compression, as well as color Doppler and duplex ultrasound were performed to evaluate the upper extremity deep venous system from the level of the subclavian vein and including the jugular, axillary, basilic, radial, ulnar and upper cephalic vein. Spectral Doppler was utilized to evaluate flow at rest and with distal augmentation maneuvers. COMPARISON:  CT 12/31/2015 FINDINGS: Contralateral Subclavian Vein: Respiratory phasicity is normal and symmetric with the symptomatic side. No evidence of thrombus. Normal compressibility. Internal Jugular Vein: Occlusive noncompressible thrombus. Subclavian Vein: Occlusive noncompressible thrombus. Axillary Vein: No evidence of thrombus. Normal compressibility, respiratory phasicity and response to augmentation. Cephalic Vein: No evidence of thrombus. Normal compressibility, respiratory phasicity and response to augmentation. Basilic Vein: Near occlusive thrombus with decreased compressibility. Brachial Veins: No evidence of thrombus. Normal compressibility, respiratory phasicity and response to augmentation. Radial Veins: No evidence of thrombus. Normal compressibility, respiratory phasicity and response to augmentation. Ulnar Veins: No evidence of thrombus. Normal compressibility, respiratory phasicity and response to augmentation. Other Findings:  None visualized. IMPRESSION: Positive study for occlusive thrombosis of the right internal jugular vein and right subclavian vein . Near occlusive thrombus is noted in the basilic vein. These results will be called to the ordering clinician or representative by the Radiologist Assistant, and communication documented in the PACS or zVision Dashboard. Electronically Signed   By: Maisie Fus  Register    On: 12/31/2015 12:05     Assessment and Recommendation  60 y.o. female with new onset significant cough shortness of breath and pulmonary edema with wheezing and lower extremity edema and minimal elevation of troponin  most consistent with demand ischemia.  Cardiac catheterization shows severe LV systolic dysfunction with ejection fraction of 20% and normal coronary arteries    Pulmonary embolism possible use from right upper subclavian and superior vena cava thrombus for which the patient has had a family history of coagulopathy 1. Continue watch for hemodynamic instability and improvements of chest discomfort 2. Heparin for deep venous thrombosis and/or pulmonary embolism 3. Continue medication management for cardiomyopathy and LV systolic dysfunction as before 4. Continue treatment of cardiomyopathy but further investigation of coagulopathy with possible long-term treatment with anticoagulation Signed, Arnoldo Hooker M.D. FACC

## 2015-12-31 NOTE — Progress Notes (Signed)
Initial appointment made at the [facility] on January 15, 2016 at 9:00am. Thank you.

## 2015-12-31 NOTE — Progress Notes (Signed)
Pt s/p heart catherization to rt groin, alert and oriented x 4.  No distress on ra.  IVF infusing well lt wrist.  Cardiac monitor in place, pt denies chest pain.  Lungs diminished lower lobes bil.  SL rt wrist.  Dressing to rt groin dry and intact, no bleeding at this time.  Denies need. CB in reach, SR up x 2.

## 2015-12-31 NOTE — Progress Notes (Signed)
To ultrasound via bed 

## 2015-12-31 NOTE — Plan of Care (Signed)
Problem: Physical Regulation: Goal: Will remain free from infection Outcome: Not Progressing IV antibiotics  Problem: Tissue Perfusion: Goal: Risk factors for ineffective tissue perfusion will decrease Outcome: Progressing Heparin drip

## 2015-12-31 NOTE — Progress Notes (Signed)
To nuclear medicine via bed 

## 2015-12-31 NOTE — Progress Notes (Signed)
To cath lab via bed.

## 2015-12-31 NOTE — Progress Notes (Signed)
ANTICOAGULATION CONSULT NOTE - Follow Up Consult  Pharmacy Consult for Heparin drip Indication: pulmonary embolus and DVT  Allergies  Allergen Reactions  . Latex Dermatitis    Patient Measurements: Height:  (175.3 cm) Weight: 168 lb 1.6 oz (76.25 kg) IBW/kg (Calculated) : 66.2 Heparin Dosing Weight: 76.3 kg  Vital Signs: Temp: 99.8 F (37.7 C) (01/30 1948) Temp Source: Oral (01/30 1948) BP: 131/109 mmHg (01/30 1948) Pulse Rate: 86 (01/30 1948)  Labs:  Recent Labs  12/29/15 0447 12/30/15 0658 12/31/15 0509 12/31/15 1214 12/31/15 1826  HGB  --   --  13.3  --   --   HCT  --   --  41.4  --   --   PLT  --   --  412  --   --   APTT  --   --   --  26  --   LABPROT  --   --  15.5*  --   --   INR  --   --  1.21  --   --   HEPARINUNFRC  --   --   --   --  0.24*  CREATININE 0.84 1.05* 1.11*  --   --     Estimated Creatinine Clearance: 57.7 mL/min (by C-G formula based on Cr of 1.11).   Medical History: Past Medical History  Diagnosis Date  . Depression   . DDD (degenerative disc disease), cervical   . Anxiety   . Fibromyalgia     Medications:  Infusions:  . heparin 1,300 Units/hr (12/31/15 1237)    Assessment: Patient is a 60yo female admitted for CHF. Now with SOB and chest discomfort. CT shows possible PE/DVT. Pharmacy consulted to dose Heparin infusion without a bolus.  Heparin drip at current rate of 1300 units/hr with subtherapeutic HL.  1/30 Hgb=13.3, Plt=412, INR=1.21 1/30 1826 HL=0.24  Goal of Therapy:  Heparin level 0.3-0.7 units/ml Monitor platelets by anticoagulation protocol: Yes   Plan:  Per protocol, will order heparin bolus of 1150 units IV once and increase drip rate to 1450 units/hr.  Will recheck HL in 6 hours at 0200. CBC in AM.   Clarisa Schools, PharmD Clinical Pharmacist 12/31/2015

## 2015-12-31 NOTE — Progress Notes (Signed)
[facility] Cardiology [facility] Encounter Note  Patient: Maureen Grant / Admit Date: 12/27/2015 / Date of Encounter: 12/31/2015, 8:59 AM   Subjective: Acute onset of right sided axillary and right sided chest discomfort radiating into the back. This pain was related to 8 out of 10 and sharp. It increased with inspiration and relieved with exploration. There was no evidence of tenderness of the abdomen or evidence of retroperitoneal bleed type symptoms. The patient does have exacerbation with movement. The patient has had normal vitals with normal heart rate and blood pressure and oxygenation at this time CAT scan has been ordered and reviewed and she does have some pleural effusion but no evidence of retroperitoneal bleed and/or other changes. Patient has slow improvements with no   hemodynamic instability.  Review of Systems: Positive for: Chest pain Negative for: Vision change, hearing change, syncope, dizziness, nausea, vomiting,diarrhea, bloody stool, stomach pain,  congestion, diaphoresis, urinary frequency, urinary pain,skin lesions, skin rashes Others previously listed  Objective: Telemetry: Normal sinus rhythm Physical Exam: Blood pressure 96/49, pulse 85, temperature 98.6 F (37 C), temperature source Oral, resp. rate 24, height  (1.753 m), weight 168 lb 1.6 oz (76.25 kg), SpO2 95 %. Body mass index is 24.81 kg/(m^2). General: Well developed, well nourished, in no acute distress. Head: Normocephalic, atraumatic, sclera non-icteric, no xanthomas, nares are without discharge. Neck: No apparent masses Lungs: Normal respirations with few wheezes, no rhonchi, no rales , some basilar crackles   Heart: Regular rate and rhythm, normal S1 S2, no murmur, no rub, no gallop, PMI is normal size and placement, carotid upstroke normal without bruit, jugular venous pressure normal Abdomen: Soft, non-tender, non-distended with normoactive bowel sounds. No hepatosplenomegaly. Abdominal aorta is  normal size without bruit Extremities: No edema, no clubbing, no cyanosis, no ulcers,  Peripheral: 2+ radial, 2+ femoral, 2+ dorsal pedal pulses Neuro: Alert and oriented. Moves all extremities spontaneously. Psych:  Responds to questions appropriately with a normal affect.   Intake/Output Summary (Last 24 hours) at 12/31/15 0859 Last data filed at 12/31/15 0522  Gross per 24 hour  Intake    120 ml  Output   1750 ml  Net  -1630 ml    Inpatient Medications:  . [MAR Hold] aspirin EC  81 mg Oral Daily  . [MAR Hold] enoxaparin (LOVENOX) injection  40 mg Subcutaneous Q24H  . [MAR Hold] furosemide  40 mg Intravenous BID  . [MAR Hold] Influenza vac split quadrivalent PF  0.5 mL Intramuscular Tomorrow-1000  . [MAR Hold] losartan  25 mg Oral Daily  . [MAR Hold] methocarbamol  750 mg Oral QHS  . [MAR Hold] metoprolol succinate  12.5 mg Oral Daily  . [MAR Hold] potassium chloride  40 mEq Oral Daily  . [MAR Hold] sodium chloride flush  3 mL Intravenous Q12H  . [MAR Hold] sodium chloride flush  3 mL Intravenous Q12H  . sodium chloride flush  3 mL Intravenous Q12H  . sodium chloride flush  3 mL Intravenous Q12H  . [MAR Hold] venlafaxine XR  150 mg Oral Q breakfast   Infusions:  . sodium chloride 1 mL/kg/hr (12/31/15 0726)  . sodium chloride      Labs:  Recent Labs  12/30/15 0658 12/31/15 0509  NA 136 137  K 5.0 4.8  CL 98* 98*  CO2 32 33*  GLUCOSE 101* 104*  BUN 16 16  CREATININE 1.05* 1.11*  CALCIUM 8.6* 8.6*  MG 1.9  --    No results for input(s): AST,  ALT, ALKPHOS, BILITOT, PROT, ALBUMIN in the last 72 hours.  Recent Labs  12/31/15 0509  WBC 7.9  HGB 13.3  HCT 41.4  MCV 84.4  PLT 412   No results for input(s): CKTOTAL, CKMB, TROPONINI in the last 72 hours. Invalid input(s): POCBNP No results for input(s): HGBA1C in the last 72 hours.   Weights: Filed Weights   12/29/15 0506 12/30/15 0459 12/31/15 0524  Weight: 172 lb 3.2 oz (78.109 kg) 169 lb 6.4 oz (76.839  kg) 168 lb 1.6 oz (76.25 kg)     Radiology/Studies:  Dg Chest 2 View  12/27/2015  CLINICAL DATA:  Shortness of breath and chest pain for 2 months EXAM: CHEST  2 VIEW COMPARISON:  Chest CT December 20, 2015 FINDINGS: There is a sizable left pleural effusion with atelectasis/ consolidation in portions of the lingula and left lower lobe. There is a minimal right effusion. The right lung is otherwise clear. Heart is upper normal in size with pulmonary vascular within normal limits. No adenopathy. No bone lesions. IMPRESSION: Sizable left pleural effusion with atelectasis/ consolidation in portions of the lingula and left lower lobe. There is increased opacity in this area compared to recent CT. Minimal right effusion. Right lung otherwise clear. No change in cardiac silhouette. Electronically Signed   By: Bretta Bang III M.D.   On: 12/27/2015 09:14   Ct Chest Wo Contrast  12/20/2015  CLINICAL DATA:  Coursing short of breath.  Cough 1/2 weeks. EXAM: CT CHEST WITHOUT CONTRAST TECHNIQUE: Multidetector CT imaging of the chest was performed following the standard protocol without IV contrast. COMPARISON:  None. FINDINGS: Mediastinum/Nodes: No axillary supraclavicular adenopathy. Mild thyroid nodularity with calcification. No mediastinal hilar or adenopathy. No pericardial fluid. Heart appears enlarged. Esophagus is normal. Lungs/Pleura: Bilateral moderate pleural effusions. No pulmonary edema or interlobular septal thickening mild basilar atelectasis. Airways are normal. Upper abdomen: Several low-attenuation lesions within the liver are likely benign but cannot be fully characterized (image 68 series 2) focal fatty infiltration along the falciform ligament. Normal adrenal glands. Musculoskeletal: Degenerate spurring of spine at multiple levels. Endplate narrowing. IMPRESSION: 1. Cardiomegaly and bilateral pleural effusions suggest mild heart failure. 2. No edema or pulmonary infection. Electronically Signed    By: Genevive Bi M.D.   On: 12/20/2015 10:25     Assessment and Recommendation  60 y.o. female with new onset significant cough shortness of breath and pulmonary edema with wheezing and lower extremity edema and minimal elevation of troponin  most consistent with demand ischemia.  Cardiac catheterization shows severe LV systolic dysfunction with ejection fraction of 20% and normal coronary arteries   New-onset right-sided pleuritic and musculoskeletal type chest discomfort slowly improving of unknown etiology without current CT chest abnormalities although we're waiting for full report from radiology 1. Continue watch for hemodynamic instability and improvements of chest discomfort 2. No use of heparin at this time due to normal coronary arteries 3. Continue medication management for cardiomyopathy and LV systolic dysfunction as before 4. Further treatment based on symptoms and report of CT Signed, Arnoldo Hooker M.D. FACC

## 2015-12-31 NOTE — Progress Notes (Signed)
Second IV attempted.  2 attempts performed- IV's blew.  Pt still has working IV in left forearm/wrist. Maureen Grant

## 2015-12-31 NOTE — Progress Notes (Signed)
Back from ultrasound

## 2015-12-31 NOTE — Progress Notes (Signed)
[facility] Cardiology [facility] Encounter Note  Patient: Maureen Grant / Admit Date: 12/27/2015 / Date of Encounter: 12/31/2015, 8:00 AM   Subjective: Overall patient is feeling much better since admission. Patient still has mild shortness of breath  Review of Systems: Positive for:  shortness of breath Negative for: Vision change, hearing change, syncope, dizziness, nausea, vomiting,diarrhea, bloody stool, stomach pain,  congestion, diaphoresis, urinary frequency, urinary pain,skin lesions, skin rashes Others previously listed  Objective: Telemetry: Normal sinus rhythm Physical Exam: Blood pressure 111/73, pulse 86, temperature 98.6 F (37 C), temperature source Oral, resp. rate 16, height  (1.753 m), weight 168 lb 1.6 oz (76.25 kg), SpO2 95 %. Body mass index is 24.81 kg/(m^2). General: Well developed, well nourished, in no acute distress. Head: Normocephalic, atraumatic, sclera non-icteric, no xanthomas, nares are without discharge. Neck: No apparent masses Lungs: Normal respirations with few wheezes, no rhonchi, no rales , some basilar crackles   Heart: Regular rate and rhythm, normal S1 S2, no murmur, no rub, no gallop, PMI is normal size and placement, carotid upstroke normal without bruit, jugular venous pressure normal Abdomen: Soft, non-tender, non-distended with normoactive bowel sounds. No hepatosplenomegaly. Abdominal aorta is normal size without bruit Extremities: No edema, no clubbing, no cyanosis, no ulcers,  Peripheral: 2+ radial, 2+ femoral, 2+ dorsal pedal pulses Neuro: Alert and oriented. Moves all extremities spontaneously. Psych:  Responds to questions appropriately with a normal affect.   Intake/Output Summary (Last 24 hours) at 12/31/15 0800 Last data filed at 12/31/15 0522  Gross per 24 hour  Intake    363 ml  Output   1750 ml  Net  -1387 ml    Inpatient Medications:  . [MAR Hold] aspirin EC  81 mg Oral Daily  . [MAR Hold] enoxaparin (LOVENOX)  injection  40 mg Subcutaneous Q24H  . [MAR Hold] furosemide  40 mg Intravenous BID  . [MAR Hold] Influenza vac split quadrivalent PF  0.5 mL Intramuscular Tomorrow-1000  . [MAR Hold] losartan  25 mg Oral Daily  . [MAR Hold] methocarbamol  750 mg Oral QHS  . [MAR Hold] metoprolol succinate  12.5 mg Oral Daily  . [MAR Hold] potassium chloride  40 mEq Oral Daily  . [MAR Hold] sodium chloride flush  3 mL Intravenous Q12H  . [MAR Hold] sodium chloride flush  3 mL Intravenous Q12H  . sodium chloride flush  3 mL Intravenous Q12H  . [MAR Hold] venlafaxine XR  150 mg Oral Q breakfast   Infusions:  . sodium chloride 1 mL/kg/hr (12/31/15 0726)    Labs:  Recent Labs  12/30/15 0658 12/31/15 0509  NA 136 137  K 5.0 4.8  CL 98* 98*  CO2 32 33*  GLUCOSE 101* 104*  BUN 16 16  CREATININE 1.05* 1.11*  CALCIUM 8.6* 8.6*  MG 1.9  --    No results for input(s): AST, ALT, ALKPHOS, BILITOT, PROT, ALBUMIN in the last 72 hours.  Recent Labs  12/31/15 0509  WBC 7.9  HGB 13.3  HCT 41.4  MCV 84.4  PLT 412   No results for input(s): CKTOTAL, CKMB, TROPONINI in the last 72 hours. Invalid input(s): POCBNP No results for input(s): HGBA1C in the last 72 hours.   Weights: Filed Weights   12/29/15 0506 12/30/15 0459 12/31/15 0524  Weight: 172 lb 3.2 oz (78.109 kg) 169 lb 6.4 oz (76.839 kg) 168 lb 1.6 oz (76.25 kg)     Radiology/Studies:  Dg Chest 2 View  12/27/2015  CLINICAL DATA:  Shortness of breath and chest pain for 2 months EXAM: CHEST  2 VIEW COMPARISON:  Chest CT December 20, 2015 FINDINGS: There is a sizable left pleural effusion with atelectasis/ consolidation in portions of the lingula and left lower lobe. There is a minimal right effusion. The right lung is otherwise clear. Heart is upper normal in size with pulmonary vascular within normal limits. No adenopathy. No bone lesions. IMPRESSION: Sizable left pleural effusion with atelectasis/ consolidation in portions of the lingula and  left lower lobe. There is increased opacity in this area compared to recent CT. Minimal right effusion. Right lung otherwise clear. No change in cardiac silhouette. Electronically Signed   By: Bretta Bang III M.D.   On: 12/27/2015 09:14   Ct Chest Wo Contrast  12/20/2015  CLINICAL DATA:  Coursing short of breath.  Cough 1/2 weeks. EXAM: CT CHEST WITHOUT CONTRAST TECHNIQUE: Multidetector CT imaging of the chest was performed following the standard protocol without IV contrast. COMPARISON:  None. FINDINGS: Mediastinum/Nodes: No axillary supraclavicular adenopathy. Mild thyroid nodularity with calcification. No mediastinal hilar or adenopathy. No pericardial fluid. Heart appears enlarged. Esophagus is normal. Lungs/Pleura: Bilateral moderate pleural effusions. No pulmonary edema or interlobular septal thickening mild basilar atelectasis. Airways are normal. Upper abdomen: Several low-attenuation lesions within the liver are likely benign but cannot be fully characterized (image 68 series 2) focal fatty infiltration along the falciform ligament. Normal adrenal glands. Musculoskeletal: Degenerate spurring of spine at multiple levels. Endplate narrowing. IMPRESSION: 1. Cardiomegaly and bilateral pleural effusions suggest mild heart failure. 2. No edema or pulmonary infection. Electronically Signed   By: Genevive Bi M.D.   On: 12/20/2015 10:25     Assessment and Recommendation  60 y.o. female with new onset significant cough shortness of breath and pulmonary edema with wheezing and lower extremity edema and minimal elevation of troponin  most consistent with demand ischemia.  Cardiac catheterization shows severe LV systolic dysfunction with ejection fraction of 20% and normal coronary arteries  1. Continue metoprolol for LV systolic dysfunction and congestive heart failure type symptoms 2. Further consideration of entresto for treatment of cardiomyopathy and LV systolic dysfunction 3. Cardiac  rehabilitation has been discussed and recommended 4.  Begin ambulation and follow for any further significant symptoms with adjustments of medications. Patient possible discharged home later today if ambulating well and achieving blood pressure goals  Signed, Arnoldo Hooker M.D. FACC

## 2016-01-01 DIAGNOSIS — M5030 Other cervical disc degeneration, unspecified cervical region: Secondary | ICD-10-CM

## 2016-01-01 DIAGNOSIS — F329 Major depressive disorder, single episode, unspecified: Secondary | ICD-10-CM

## 2016-01-01 DIAGNOSIS — Z79899 Other long term (current) drug therapy: Secondary | ICD-10-CM

## 2016-01-01 DIAGNOSIS — F419 Anxiety disorder, unspecified: Secondary | ICD-10-CM

## 2016-01-01 DIAGNOSIS — Z801 Family history of malignant neoplasm of trachea, bronchus and lung: Secondary | ICD-10-CM

## 2016-01-01 DIAGNOSIS — M797 Fibromyalgia: Secondary | ICD-10-CM

## 2016-01-01 DIAGNOSIS — Z832 Family history of diseases of the blood and blood-forming organs and certain disorders involving the immune mechanism: Secondary | ICD-10-CM

## 2016-01-01 DIAGNOSIS — I82C11 Acute embolism and thrombosis of right internal jugular vein: Principal | ICD-10-CM

## 2016-01-01 DIAGNOSIS — I2699 Other pulmonary embolism without acute cor pulmonale: Secondary | ICD-10-CM

## 2016-01-01 LAB — HEPARIN LEVEL (UNFRACTIONATED)
HEPARIN UNFRACTIONATED: 0.12 [IU]/mL — AB (ref 0.30–0.70)
Heparin Unfractionated: 0.69 IU/mL (ref 0.30–0.70)

## 2016-01-01 LAB — CBC
HEMATOCRIT: 40.6 % (ref 35.0–47.0)
HEMOGLOBIN: 13 g/dL (ref 12.0–16.0)
MCH: 27.1 pg (ref 26.0–34.0)
MCHC: 32 g/dL (ref 32.0–36.0)
MCV: 84.6 fL (ref 80.0–100.0)
Platelets: 405 10*3/uL (ref 150–440)
RBC: 4.8 MIL/uL (ref 3.80–5.20)
RDW: 15.3 % — ABNORMAL HIGH (ref 11.5–14.5)
WBC: 8.4 10*3/uL (ref 3.6–11.0)

## 2016-01-01 MED ORDER — POLYETHYLENE GLYCOL 3350 17 G PO PACK
17.0000 g | PACK | Freq: Every day | ORAL | Status: DC
  Administered 2016-01-01: 17 g via ORAL
  Filled 2016-01-01 (×2): qty 1

## 2016-01-01 MED ORDER — APIXABAN 5 MG PO TABS: 5.0000 mg | ORAL_TABLET | Freq: Two times a day (BID) | ORAL | Status: DC

## 2016-01-01 MED ORDER — APIXABAN 5 MG PO TABS
10.0000 mg | ORAL_TABLET | Freq: Two times a day (BID) | ORAL | Status: DC
  Administered 2016-01-01 – 2016-01-02 (×2): 10 mg via ORAL
  Filled 2016-01-01 (×2): qty 2

## 2016-01-01 NOTE — Care Management (Signed)
Patient started on heparin drip for concern of pulmonary embolus.  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 Oncology is to assess for underlying coagulation disorder.  Her mother died recently of pulmonary embolus

## 2016-01-01 NOTE — Consult Note (Signed)
[facility] Vascular Consult Note  MRN : [id number]  Maureen Grant is a 60 y.o. (1956-05-31) female who presents with chief complaint of  Chief Complaint  Patient presents with  . Shortness of Breath  .  History of Present Illness: I am asked to see the patient by the Hospitalist service for a right basilic vein, subclavian vein, and jugular vein DVT.  Patient has been admitted with CHF and been in the hospital several days.  She had an IV in the right arm at the basilic site that was in her arm for 3-4 days and became swollen and sore.  This was removed but the discomfort persisted and her arm became more swollen.  This has subsided a little with the initiation of heparin last night.  There have apparently been family members with clotting issues previously.  She had a heart catheterization yesterday and with pain after the procedure a CT scan was done without contrast that I independently reviewed.  She then had a duplex which showed right basilic, subclavian, and jugular vein DVT.  A V-Q scan was low probability for PE.  She has SOB from her effusions and CHF, but no recent worsening.  Started on heparin and we were consulted.  Current Facility-Administered Medications  Medication Dose Route Frequency Provider Last Rate Last Dose  . acetaminophen (TYLENOL) tablet 650 mg  650 mg Oral Q4H PRN Lamar Blinks, MD      . albuterol (PROVENTIL) (2.5 MG/3ML) 0.083% nebulizer solution 2.5 mg  2.5 mg Nebulization Q2H PRN Milagros Loll, MD      . aspirin EC tablet 81 mg  81 mg Oral Daily Srikar Sudini, MD   81 mg at 01/01/16 0800  . diphenhydrAMINE (BENADRYL) capsule 25 mg  25 mg Oral QHS PRN Debby Crosley, MD   25 mg at 12/31/15 2234  . furosemide (LASIX) injection 40 mg  40 mg Intravenous BID Milagros Loll, MD   40 mg at 01/01/16 0756  . heparin ADULT infusion 100 units/mL (25000 units/250 mL)  1,450 Units/hr Intravenous Continuous Sherry Ruffing, RPH 14.5 mL/hr at  01/01/16 0550 1,450 Units/hr at 01/01/16 0550  . HYDROcodone-homatropine (HYCODAN) 5-1.5 MG/5ML syrup 5 mL  5 mL Oral Q8H PRN Srikar Sudini, MD      . Influenza vac split quadrivalent PF (FLUARIX) injection 0.5 mL  0.5 mL Intramuscular Tomorrow-1000 Cy Blamer, RPH      . levofloxacin (LEVAQUIN) IVPB 750 mg  750 mg Intravenous Q24H Ramonita Lab, MD   750 mg at 01/01/16 0801  . losartan (COZAAR) tablet 25 mg  25 mg Oral Daily Ramonita Lab, MD   25 mg at 01/01/16 0758  . methocarbamol (ROBAXIN) tablet 750 mg  750 mg Oral QHS Milagros Loll, MD   750 mg at 12/31/15 2234  . metoprolol succinate (TOPROL-XL) 24 hr tablet 12.5 mg  12.5 mg Oral Daily Milagros Loll, MD   12.5 mg at 01/01/16 0758  . ondansetron (ZOFRAN) tablet 4 mg  4 mg Oral Q6H PRN Milagros Loll, MD       Or  . ondansetron (ZOFRAN) injection 4 mg  4 mg Intravenous Q6H PRN Milagros Loll, MD   4 mg at 12/30/15 1503  . ondansetron (ZOFRAN) injection 4 mg  4 mg Intravenous Q6H PRN Lamar Blinks, MD      . piperacillin-tazobactam (ZOSYN) IVPB 3.375 g  3.375 g Intravenous 3 times per day Ramonita Lab, MD   3.375 g at 01/01/16  0550  . polyethylene glycol (MIRALAX / GLYCOLAX) packet 17 g  17 g Oral Daily PRN Srikar Sudini, MD      . potassium chloride SA (K-DUR,KLOR-CON) CR tablet 40 mEq  40 mEq Oral Daily Milagros Loll, MD   40 mEq at 01/01/16 0759  . promethazine (PHENERGAN) injection 12.5 mg  12.5 mg Intravenous Q8H PRN Ramonita Lab, MD   12.5 mg at 12/30/15 1636  . sodium chloride flush (NS) 0.9 % injection 3 mL  3 mL Intravenous PRN Lamar Blinks, MD      . venlafaxine XR (EFFEXOR-XR) 24 hr capsule 150 mg  150 mg Oral Q breakfast Milagros Loll, MD   150 mg at 01/01/16 0800    Past Medical History  Diagnosis Date  . Depression   . DDD (degenerative disc disease), cervical   . Anxiety   . Fibromyalgia     Past Surgical History  Procedure Laterality Date  . Hx of rotator cuff surgery Right 10/07/2001  . Cardiac  catheterization N/A 12/31/2015    Procedure: Right/Left Heart Cath and Coronary Angiography;  Surgeon: Lamar Blinks, MD;  Location: [facility] INVASIVE CV LAB;  Service: Cardiovascular;  Laterality: N/A;    Social History Social History  Substance Use Topics  . Smoking status: Former Smoker -- 0.50 packs/day for 45 years    Quit date: 11/12/2013  . Smokeless tobacco: None  . Alcohol Use: No  No IVDU  Family History Family History  Problem Relation Age of Onset  . Arthritis Mother   . Heart disease Mother   . Depression Sister   . Cancer Maternal Grandmother   . Cancer Maternal Grandfather     Lung  . Alcohol abuse Maternal Grandfather   . Diabetes Paternal Grandmother   reports several family members with clotting issues  Allergies  Allergen Reactions  . Latex Dermatitis     REVIEW OF SYSTEMS (Negative unless checked)  Constitutional: Weight loss  Fever  Chills Cardiac: Chest pain   Chest pressure   Palpitations   Shortness of breath when laying flat   Shortness of breath at rest   Shortness of breath with exertion. Vascular:  Pain in legs with walking   Pain in legs at rest   Pain in legs when laying flat   Claudication   Pain in feet when walking  Pain in feet at rest  Pain in feet when laying flat   History of DVT   Phlebitis   Swelling in legs   Varicose veins   Non-healing ulcers Pulmonary:   Uses home oxygen   Productive cough   Hemoptysis   Wheeze  COPD   Asthma Neurologic:  Dizziness  Blackouts   Seizures   History of stroke   History of TIA  Aphasia   Temporary blindness   Dysphagia   Weakness or numbness in arms   Weakness or numbness in legs Musculoskeletal:  Arthritis   Joint swelling   Joint pain   Low back pain Hematologic:  Easy bruising  Easy bleeding   Hypercoagulable state   Anemic  Hepatitis Gastrointestinal:  Blood in stool   Vomiting blood  Gastroesophageal  reflux/heartburn   Difficulty swallowing. Genitourinary:  Chronic kidney disease   Difficult urination  Frequent urination  Burning with urination   Blood in urine Skin:  Rashes   Ulcers   Wounds Psychological:  History of anxiety    History of major depression.  Physical Examination  Filed Vitals:   12/31/15 1948 12/31/15  2033 01/01/16 0441 01/01/16 0758  BP: 131/109 100/53 103/63 103/63  Pulse: 86 79 89 91  Temp: 99.8 F (37.7 C)  98.9 F (37.2 C)   TempSrc: Oral  Oral   Resp: 18  18   Height:   5\' 9"  (1.753 m)   Weight:   76.658 kg (169 lb)   SpO2: 92% 91% 96%    Body mass index is 24.95 kg/(m^2). Gen:  WD/WN, NAD Head: Naomi/AT, No temporalis wasting. Prominent temp pulse not noted. Ear/Nose/Throat: Hearing grossly intact, nares w/o erythema or drainage, oropharynx w/o Erythema/Exudate Eyes: PERRLA, EOMI.  Neck: Supple, no nuchal rigidity.  No JVD.  Pulmonary:  Good air movement, crackles in bases Cardiac: RRR, normal S1, S2 Vascular:  Vessel Right Left  Radial Palpable Palpable  Ulnar Palpable Palpable  Brachial Palpable Palpable  Carotid Palpable, without bruit Palpable, without bruit  Aorta Not palpable N/A  Femoral Palpable Palpable  Popliteal Palpable Palpable  PT Palpable Palpable  DP Palpable Palpable   Gastrointestinal: soft, non-tender/non-distended. No guarding/reflex. No masses, surgical incisions, or scars. Musculoskeletal: M/S 5/5 throughout.  Extremities without ischemic changes.  Right arm mildly swollen.  Cord present at basilic vein from previous IV. Mild LE swelling Neurologic: CN 2-12 intact. Pain and light touch intact in extremities.  Symmetrical.  Speech is fluent. Motor exam as listed above. Psychiatric: Judgment intact, Mood & affect appropriate for pt's clinical situation. Dermatologic: No rashes or ulcers noted.  No cellulitis or open wounds. Lymph : No Cervical, Axillary, or Inguinal lymphadenopathy.   CBC Lab Results   Component Value Date   WBC 8.4 01/01/2016   HGB 13.0 01/01/2016   HCT 40.6 01/01/2016   MCV 84.6 01/01/2016   PLT 405 01/01/2016    BMET    Component Value Date/Time   NA 137 12/31/2015 0509   NA 142 05/02/2014 1117   K 4.8 12/31/2015 0509   K 3.7 05/02/2014 1117   CL 98* 12/31/2015 0509   CL 106 05/02/2014 1117   CO2 33* 12/31/2015 0509   CO2 32 05/02/2014 1117   GLUCOSE 104* 12/31/2015 0509   GLUCOSE 76 05/02/2014 1117   BUN 16 12/31/2015 0509   BUN 16 05/02/2014 1117   CREATININE 1.11* 12/31/2015 0509   CREATININE 0.82 05/02/2014 1117   CALCIUM 8.6* 12/31/2015 0509   CALCIUM 8.9 05/02/2014 1117   GFRNONAA 54* 12/31/2015 0509   GFRNONAA >60 05/02/2014 1117   GFRAA >60 12/31/2015 0509   GFRAA >60 05/02/2014 1117   Estimated Creatinine Clearance: 57.7 mL/min (by C-G formula based on Cr of 1.11).  COAG Lab Results  Component Value Date   INR 1.21 12/31/2015    Radiology Ct Abdomen Wo Contrast  12/31/2015  CLINICAL DATA:  Left chest and abdominal pain status post cardiac catheterization. CHF. EXAM: CT CHEST AND ABDOMEN WITHOUT CONTRAST TECHNIQUE: Multidetector CT imaging of the chest and abdomen was performed following the standard protocol without IV contrast. COMPARISON:  12/20/2015 chest CT . FINDINGS: CT CHEST Mediastinum/Nodes: Stable mild cardiomegaly. Minimal pericardial fluid/ thickening, slightly increased. Great vessels are normal in course and caliber. Stable hypodense 1.1 cm anterior right thyroid lobe nodule and 1.0 cm coarsely calcified anterior left thyroid lobe nodule. Normal esophagus. No pathologically enlarged axillary, mediastinal or gross hilar lymph nodes, noting limited sensitivity for the detection of hilar adenopathy on this noncontrast study. There is a new central low-density filling defect within the right internal jugular vein, suspicious for acute deep venous thrombosis. Lungs/Pleura: No pneumothorax. Small  layering right pleural effusion,  decreased since 12/19/2015. Small layering left pleural effusion, increased since 12/20/2015. There are new wedge-shaped subpleural focus of patchy consolidation with surrounding ground-glass opacity in the anterior right lower lobe (series 4/ image 43) and basilar right lower lobe (series 4/ image 59). There is new patchy consolidation and ground-glass opacity in the lingula and left lower lobe, with components of passive atelectasis in the lingula and left lower lobe. Mild interlobular septal thickening in the upper lobes likely represents mild pulmonary edema. Musculoskeletal: No aggressive appearing focal osseous lesions. Marked degenerative changes in the thoracic spine. CT ABDOMEN Hepatobiliary: There is a 1.0 cm simple liver cyst in the segment 4B left liver lobe. There are at least 3 additional scattered subcentimeter hypodense lesions throughout the liver, too small to characterize. There is a subcapsular focus of low-density in the anterior medial segment left liver lobe adjacent to the intersegmental fissure, favor focal fat. Normal gallbladder with no radiopaque cholelithiasis. No biliary ductal dilatation. Pancreas: Normal, with no mass or duct dilation. Spleen: Normal size. No mass. Adrenals/Urinary Tract: Normal adrenals. There is symmetric excreted contrast in the bilateral renal collecting systems. No hydronephrosis. No renal mass. Stomach/Bowel: Tiny hiatal hernia. Otherwise collapsed and grossly normal stomach. Visualized small and large bowel is normal caliber, with no bowel wall thickening. Vascular/Lymphatic: Normal caliber abdominal aorta. No pathologically enlarged lymph nodes in the abdomen. Other: No pneumoperitoneum, ascites or focal fluid collection. No evidence of a retroperitoneal hematoma. Musculoskeletal: No aggressive appearing focal osseous lesions. Marked degenerative changes in the lumbar spine. IMPRESSION: 1. New central low-density filling defect in the right internal jugular  vein, suspicious for acute deep venous thrombosis. Recommend correlation with right upper extremity venous Doppler scan. 2. New wedge shaped subpleural foci of patchy consolidation with surrounding ground-glass opacity in the right lower lobe. New patchy consolidation and ground-glass opacity in the lingula and left lower lobe. Findings are nonspecific and could represent multifocal pneumonia, however the possibility of pulmonary infarcts due to pulmonary embolism is not excluded. Correlate with chest CT angiography or V/Q scan as indicated. 3. Small left greater than right layering bilateral pleural effusions, decreased on the right and increased on the left, with associated passive lower lobe atelectasis. 4. Stable findings of mild congestive heart failure including mild cardiomegaly and mild pulmonary edema. 5. Minimal pericardial fluid/thickening, slightly increased. 6. No acute abnormality in the abdomen. No evidence of a retroperitoneal hematoma. Tiny hiatal hernia. These results will be called to the ordering clinician or representative by the Radiologist Assistant, and communication documented in the PACS or zVision Dashboard. Electronically Signed   By: Delbert Phenix M.D.   On: 12/31/2015 09:17   Dg Chest 2 View  12/31/2015  CLINICAL DATA:  Pt having a VQ lung scan today. She is also having right sided chest pain today. Hx of fibromyalgia. Former smoker; quit 2014 EXAM: CHEST  2 VIEW COMPARISON:  Chest CT 12/31/2015, chest x-ray 12/27/2015 FINDINGS: Heart margins are obscured by bibasilar opacity. There are bilateral pleural effusions. Left lung base opacity consistent with atelectasis or infiltrate and appear stable. No evidence for pulmonary edema. Biapical pleural parenchymal thickening appears stable. IMPRESSION: Persistent significant opacity at the left lung base. Bilateral pleural effusions, left greater than right. Electronically Signed   By: Norva Pavlov M.D.   On: 12/31/2015 13:12   Dg  Chest 2 View  12/27/2015  CLINICAL DATA:  Shortness of breath and chest pain for 2 months EXAM: CHEST  2 VIEW COMPARISON:  Chest CT December 20, 2015 FINDINGS: There is a sizable left pleural effusion with atelectasis/ consolidation in portions of the lingula and left lower lobe. There is a minimal right effusion. The right lung is otherwise clear. Heart is upper normal in size with pulmonary vascular within normal limits. No adenopathy. No bone lesions. IMPRESSION: Sizable left pleural effusion with atelectasis/ consolidation in portions of the lingula and left lower lobe. There is increased opacity in this area compared to recent CT. Minimal right effusion. Right lung otherwise clear. No change in cardiac silhouette. Electronically Signed   By: Bretta Bang III M.D.   On: 12/27/2015 09:14   Ct Chest Without Contrast  12/31/2015  CLINICAL DATA:  Left chest and abdominal pain status post cardiac catheterization. CHF. EXAM: CT CHEST AND ABDOMEN WITHOUT CONTRAST TECHNIQUE: Multidetector CT imaging of the chest and abdomen was performed following the standard protocol without IV contrast. COMPARISON:  12/20/2015 chest CT . FINDINGS: CT CHEST Mediastinum/Nodes: Stable mild cardiomegaly. Minimal pericardial fluid/ thickening, slightly increased. Great vessels are normal in course and caliber. Stable hypodense 1.1 cm anterior right thyroid lobe nodule and 1.0 cm coarsely calcified anterior left thyroid lobe nodule. Normal esophagus. No pathologically enlarged axillary, mediastinal or gross hilar lymph nodes, noting limited sensitivity for the detection of hilar adenopathy on this noncontrast study. There is a new central low-density filling defect within the right internal jugular vein, suspicious for acute deep venous thrombosis. Lungs/Pleura: No pneumothorax. Small layering right pleural effusion, decreased since 12/19/2015. Small layering left pleural effusion, increased since 12/20/2015. There are new  wedge-shaped subpleural focus of patchy consolidation with surrounding ground-glass opacity in the anterior right lower lobe (series 4/ image 43) and basilar right lower lobe (series 4/ image 59). There is new patchy consolidation and ground-glass opacity in the lingula and left lower lobe, with components of passive atelectasis in the lingula and left lower lobe. Mild interlobular septal thickening in the upper lobes likely represents mild pulmonary edema. Musculoskeletal: No aggressive appearing focal osseous lesions. Marked degenerative changes in the thoracic spine. CT ABDOMEN Hepatobiliary: There is a 1.0 cm simple liver cyst in the segment 4B left liver lobe. There are at least 3 additional scattered subcentimeter hypodense lesions throughout the liver, too small to characterize. There is a subcapsular focus of low-density in the anterior medial segment left liver lobe adjacent to the intersegmental fissure, favor focal fat. Normal gallbladder with no radiopaque cholelithiasis. No biliary ductal dilatation. Pancreas: Normal, with no mass or duct dilation. Spleen: Normal size. No mass. Adrenals/Urinary Tract: Normal adrenals. There is symmetric excreted contrast in the bilateral renal collecting systems. No hydronephrosis. No renal mass. Stomach/Bowel: Tiny hiatal hernia. Otherwise collapsed and grossly normal stomach. Visualized small and large bowel is normal caliber, with no bowel wall thickening. Vascular/Lymphatic: Normal caliber abdominal aorta. No pathologically enlarged lymph nodes in the abdomen. Other: No pneumoperitoneum, ascites or focal fluid collection. No evidence of a retroperitoneal hematoma. Musculoskeletal: No aggressive appearing focal osseous lesions. Marked degenerative changes in the lumbar spine. IMPRESSION: 1. New central low-density filling defect in the right internal jugular vein, suspicious for acute deep venous thrombosis. Recommend correlation with right upper extremity venous  Doppler scan. 2. New wedge shaped subpleural foci of patchy consolidation with surrounding ground-glass opacity in the right lower lobe. New patchy consolidation and ground-glass opacity in the lingula and left lower lobe. Findings are nonspecific and could represent multifocal pneumonia, however the possibility of pulmonary infarcts due to pulmonary embolism is not excluded. Correlate  with chest CT angiography or V/Q scan as indicated. 3. Small left greater than right layering bilateral pleural effusions, decreased on the right and increased on the left, with associated passive lower lobe atelectasis. 4. Stable findings of mild congestive heart failure including mild cardiomegaly and mild pulmonary edema. 5. Minimal pericardial fluid/thickening, slightly increased. 6. No acute abnormality in the abdomen. No evidence of a retroperitoneal hematoma. Tiny hiatal hernia. These results will be called to the ordering clinician or representative by the Radiologist Assistant, and communication documented in the PACS or zVision Dashboard. Electronically Signed   By: Delbert Phenix M.D.   On: 12/31/2015 09:17   Ct Chest Wo Contrast  12/20/2015  CLINICAL DATA:  Coursing short of breath.  Cough 1/2 weeks. EXAM: CT CHEST WITHOUT CONTRAST TECHNIQUE: Multidetector CT imaging of the chest was performed following the standard protocol without IV contrast. COMPARISON:  None. FINDINGS: Mediastinum/Nodes: No axillary supraclavicular adenopathy. Mild thyroid nodularity with calcification. No mediastinal hilar or adenopathy. No pericardial fluid. Heart appears enlarged. Esophagus is normal. Lungs/Pleura: Bilateral moderate pleural effusions. No pulmonary edema or interlobular septal thickening mild basilar atelectasis. Airways are normal. Upper abdomen: Several low-attenuation lesions within the liver are likely benign but cannot be fully characterized (image 68 series 2) focal fatty infiltration along the falciform ligament. Normal  adrenal glands. Musculoskeletal: Degenerate spurring of spine at multiple levels. Endplate narrowing. IMPRESSION: 1. Cardiomegaly and bilateral pleural effusions suggest mild heart failure. 2. No edema or pulmonary infection. Electronically Signed   By: Genevive Bi M.D.   On: 12/20/2015 10:25   Nm Pulmonary Perf And Vent  12/31/2015  CLINICAL DATA:  Chest pain at rest. EXAM: NUCLEAR MEDICINE VENTILATION - PERFUSION LUNG SCAN TECHNIQUE: Ventilation images were obtained in multiple projections using inhaled aerosol Tc-96m DTPA. Perfusion images were obtained in multiple projections after intravenous injection of Tc-67m MAA. RADIOPHARMACEUTICALS:  32.46 millicuries of Technetium-86m DTPA aerosol inhalation and 3.92 millicuries of Technetium-68m MAA IV COMPARISON:  Chest x-ray dated 12/31/2015 FINDINGS: Ventilation: There is decreased perfusion at the left lung base due to the large effusion seen on chest x-ray. The ventilation exam is otherwise normal. Perfusion: There is slightly decreased profusion at the left lung base due to the effusion. There is a wedge-shaped perfusion defect at the right lung base which is felt to be due to fluid along the major fissure due to the small right effusion. IMPRESSION: No findings suggestive of pulmonary embolism. Bilateral pleural effusions, left more than right. Electronically Signed   By: Francene Boyers M.D.   On: 12/31/2015 14:44   US Venous Img Upper Uni Right  12/31/2015  CLINICAL DATA:  DVT. EXAM: RIGHT UPPER EXTREMITY VENOUS DOPPLER ULTRASOUND TECHNIQUE: Gray-scale sonography with graded compression, as well as color Doppler and duplex ultrasound were performed to evaluate the upper extremity deep venous system from the level of the subclavian vein and including the jugular, axillary, basilic, radial, ulnar and upper cephalic vein. Spectral Doppler was utilized to evaluate flow at rest and with distal augmentation maneuvers. COMPARISON:  CT 12/31/2015 FINDINGS:  Contralateral Subclavian Vein: Respiratory phasicity is normal and symmetric with the symptomatic side. No evidence of thrombus. Normal compressibility. Internal Jugular Vein: Occlusive noncompressible thrombus. Subclavian Vein: Occlusive noncompressible thrombus. Axillary Vein: No evidence of thrombus. Normal compressibility, respiratory phasicity and response to augmentation. Cephalic Vein: No evidence of thrombus. Normal compressibility, respiratory phasicity and response to augmentation. Basilic Vein: Near occlusive thrombus with decreased compressibility. Brachial Veins: No evidence of thrombus. Normal compressibility, respiratory phasicity  and response to augmentation. Radial Veins: No evidence of thrombus. Normal compressibility, respiratory phasicity and response to augmentation. Ulnar Veins: No evidence of thrombus. Normal compressibility, respiratory phasicity and response to augmentation. Other Findings:  None visualized. IMPRESSION: Positive study for occlusive thrombosis of the right internal jugular vein and right subclavian vein . Near occlusive thrombus is noted in the basilic vein. These results will be called to the ordering clinician or representative by the Radiologist Assistant, and communication documented in the PACS or zVision Dashboard. Electronically Signed   By: Maisie Fus  Register   On: 12/31/2015 12:05      Assessment/Plan 1. Right basilic, subclavian, jugular, innominate DVT.  On heparin and tolerating well.  This was likely due to IV creating a superficial thrombophlebitis on a patient with a hypercoagulable state.  Agree with clotting work up given family history.  On anticoagulation and will need this for at least three months and maybe longer if hypercoagulable condition is found.  V-Q scan was low probability of PE so doubt PE present, but would not change therapy at this time even if it was 2. Pleural effusions, L>R. Likely from CHF which is being treated 3. CHF. EF 20%.  Cardiology following    Festus Barren, MD  01/01/2016 8:51 AM

## 2016-01-01 NOTE — Consult Note (Signed)
[facility]  [phone]) [phone] [phone]  ID: Maureen Grant OB: 03-Apr-1956  MR#: [id number]  NGE#:[id number]  Patient Care Team: Margaretann Loveless, PA-C as PCP - General (Family Medicine)  CHIEF COMPLAINT:  Chief Complaint  Patient presents with  . Shortness of Breath    INTERVAL HISTORY: Patient is a 60 year old female with multiple medical problems who is admitted to the hospital with worsening shortness of breath.  Subsequent workup included CT scan which revealed right internal jugular vein DVT as well as pulmonary infarcts likely due to pulmonary embolus. Patient reports her mother recently died of blood clot and was found to have factor V Leiden. Currently she feels improved since admission but not back to baseline.  REVIEW OF SYSTEMS:   Review of Systems  Constitutional: Positive for malaise/fatigue. Negative for weight loss.  HENT: Negative.   Respiratory: Positive for shortness of breath. Negative for hemoptysis.   Cardiovascular: Negative.   Gastrointestinal: Negative.  Negative for blood in stool and melena.  Musculoskeletal: Negative.   Neurological: Positive for weakness.    As per HPI. Otherwise, a complete review of systems is negatve.  PAST MEDICAL HISTORY: Past Medical History  Diagnosis Date  . Depression   . DDD (degenerative disc disease), cervical   . Anxiety   . Fibromyalgia     PAST SURGICAL HISTORY: Past Surgical History  Procedure Laterality Date  . Hx of rotator cuff surgery Right 10/07/2001  . Cardiac catheterization N/A 12/31/2015    Procedure: Right/Left Heart Cath and Coronary Angiography;  Surgeon: Lamar Blinks, MD;  Location: [facility] INVASIVE CV LAB;  Service: Cardiovascular;  Laterality: N/A;    FAMILY HISTORY Family History  Problem Relation Age of Onset  . Arthritis Mother   . Heart disease Mother   . Depression Sister   . Cancer Maternal Grandmother   . Cancer Maternal Grandfather     Lung   . Alcohol abuse Maternal Grandfather   . Diabetes Paternal Grandmother        ADVANCED DIRECTIVES:    HEALTH MAINTENANCE: Social History  Substance Use Topics  . Smoking status: Former Smoker -- 0.50 packs/day for 45 years    Quit date: 11/12/2013  . Smokeless tobacco: None  . Alcohol Use: No     Colonoscopy:  PAP:  Bone density:  Lipid panel:  Allergies  Allergen Reactions  . Latex Dermatitis    Current Facility-Administered Medications  Medication Dose Route Frequency Provider Last Rate Last Dose  . acetaminophen (TYLENOL) tablet 650 mg  650 mg Oral Q4H PRN Lamar Blinks, MD      . albuterol (PROVENTIL) (2.5 MG/3ML) 0.083% nebulizer solution 2.5 mg  2.5 mg Nebulization Q2H PRN Milagros Loll, MD      . aspirin EC tablet 81 mg  81 mg Oral Daily Srikar Sudini, MD   81 mg at 01/01/16 0800  . diphenhydrAMINE (BENADRYL) capsule 25 mg  25 mg Oral QHS PRN Debby Crosley, MD   25 mg at 12/31/15 2234  . furosemide (LASIX) injection 40 mg  40 mg Intravenous BID Srikar Sudini, MD   40 mg at 01/01/16 0756  . heparin ADULT infusion 100 units/mL (25000 units/250 mL)  1,650 Units/hr Intravenous Continuous Ramonita Lab, MD 16.5 mL/hr at 01/01/16 1033 1,650 Units/hr at 01/01/16 1033  . HYDROcodone-homatropine (HYCODAN) 5-1.5 MG/5ML syrup 5 mL  5 mL Oral Q8H PRN Srikar Sudini, MD      . Influenza vac split quadrivalent PF (FLUARIX) injection 0.5 mL  0.5 mL Intramuscular Tomorrow-1000 Cy Blamer, RPH      . levofloxacin (LEVAQUIN) IVPB 750 mg  750 mg Intravenous Q24H Ramonita Lab, MD   750 mg at 01/01/16 0801  . losartan (COZAAR) tablet 25 mg  25 mg Oral Daily Ramonita Lab, MD   25 mg at 01/01/16 0758  . methocarbamol (ROBAXIN) tablet 750 mg  750 mg Oral QHS Milagros Loll, MD   750 mg at 01-09-2016 2234  . metoprolol succinate (TOPROL-XL) 24 hr tablet 12.5 mg  12.5 mg Oral Daily Milagros Loll, MD   12.5 mg at 01/01/16 0758  . ondansetron (ZOFRAN) tablet 4 mg  4 mg Oral Q6H PRN Milagros Loll, MD       Or  . ondansetron (ZOFRAN) injection 4 mg  4 mg Intravenous Q6H PRN Milagros Loll, MD   4 mg at 12/30/15 1503  . ondansetron (ZOFRAN) injection 4 mg  4 mg Intravenous Q6H PRN Lamar Blinks, MD      . piperacillin-tazobactam (ZOSYN) IVPB 3.375 g  3.375 g Intravenous 3 times per day Ramonita Lab, MD   3.375 g at 01/01/16 1321  . polyethylene glycol (MIRALAX / GLYCOLAX) packet 17 g  17 g Oral Daily PRN Srikar Sudini, MD      . potassium chloride SA (K-DUR,KLOR-CON) CR tablet 40 mEq  40 mEq Oral Daily Milagros Loll, MD   40 mEq at 01/01/16 0759  . promethazine (PHENERGAN) injection 12.5 mg  12.5 mg Intravenous Q8H PRN Ramonita Lab, MD   12.5 mg at 12/30/15 1636  . sodium chloride flush (NS) 0.9 % injection 3 mL  3 mL Intravenous PRN Lamar Blinks, MD      . venlafaxine XR (EFFEXOR-XR) 24 hr capsule 150 mg  150 mg Oral Q breakfast Srikar Sudini, MD   150 mg at 01/01/16 0800    OBJECTIVE: Filed Vitals:   01/01/16 1100 01/01/16 1158  BP: 95/59 95/59  Pulse: 92 92  Temp: 98.3 F (36.8 C) 98.3 F (36.8 C)  Resp: 18 20     Body mass index is 24.95 kg/(m^2).    ECOG FS:0 - Asymptomatic  General: Well-developed, well-nourished, no acute distress. Eyes: Pink conjunctiva, anicteric sclera. Lungs: Clear to auscultation bilaterally. Heart: Regular rate and rhythm. No rubs, murmurs, or gallops. Abdomen: Soft, nontender, nondistended. No organomegaly noted, normoactive bowel sounds. Musculoskeletal: No edema, cyanosis, or clubbing. Neuro: Alert, answering all questions appropriately. Cranial nerves grossly intact. Skin: No rashes or petechiae noted. Psych: Normal affect.  LAB RESULTS:  Lab Results  Component Value Date   NA 137 01/09/2016   K 4.8 2016/01/09   CL 98* 01-09-16   CO2 33* 01/09/2016   GLUCOSE 104* 01/09/2016   BUN 16 January 09, 2016   CREATININE 1.11* January 09, 2016   CALCIUM 8.6* 09-Jan-2016   PROT 7.0 05/02/2014   ALBUMIN 3.5 05/02/2014   AST 16 05/02/2014    ALT 18 05/02/2014   ALKPHOS 94 05/02/2014   BILITOT 0.4 05/02/2014   GFRNONAA 54* 2016/01/09   GFRAA >60 01-09-2016    Lab Results  Component Value Date   WBC 8.4 01/01/2016   NEUTROABS 10.6* 12/27/2015   HGB 13.0 01/01/2016   HCT 40.6 01/01/2016   MCV 84.6 01/01/2016   PLT 405 01/01/2016     STUDIES: Ct Abdomen Wo Contrast  01/09/16  CLINICAL DATA:  Left chest and abdominal pain status post cardiac catheterization. CHF. EXAM: CT CHEST AND ABDOMEN WITHOUT CONTRAST TECHNIQUE: Multidetector CT imaging of the chest and  abdomen was performed following the standard protocol without IV contrast. COMPARISON:  12/20/2015 chest CT . FINDINGS: CT CHEST Mediastinum/Nodes: Stable mild cardiomegaly. Minimal pericardial fluid/ thickening, slightly increased. Great vessels are normal in course and caliber. Stable hypodense 1.1 cm anterior right thyroid lobe nodule and 1.0 cm coarsely calcified anterior left thyroid lobe nodule. Normal esophagus. No pathologically enlarged axillary, mediastinal or gross hilar lymph nodes, noting limited sensitivity for the detection of hilar adenopathy on this noncontrast study. There is a new central low-density filling defect within the right internal jugular vein, suspicious for acute deep venous thrombosis. Lungs/Pleura: No pneumothorax. Small layering right pleural effusion, decreased since 12/19/2015. Small layering left pleural effusion, increased since 12/20/2015. There are new wedge-shaped subpleural focus of patchy consolidation with surrounding ground-glass opacity in the anterior right lower lobe (series 4/ image 43) and basilar right lower lobe (series 4/ image 59). There is new patchy consolidation and ground-glass opacity in the lingula and left lower lobe, with components of passive atelectasis in the lingula and left lower lobe. Mild interlobular septal thickening in the upper lobes likely represents mild pulmonary edema. Musculoskeletal: No aggressive  appearing focal osseous lesions. Marked degenerative changes in the thoracic spine. CT ABDOMEN Hepatobiliary: There is a 1.0 cm simple liver cyst in the segment 4B left liver lobe. There are at least 3 additional scattered subcentimeter hypodense lesions throughout the liver, too small to characterize. There is a subcapsular focus of low-density in the anterior medial segment left liver lobe adjacent to the intersegmental fissure, favor focal fat. Normal gallbladder with no radiopaque cholelithiasis. No biliary ductal dilatation. Pancreas: Normal, with no mass or duct dilation. Spleen: Normal size. No mass. Adrenals/Urinary Tract: Normal adrenals. There is symmetric excreted contrast in the bilateral renal collecting systems. No hydronephrosis. No renal mass. Stomach/Bowel: Tiny hiatal hernia. Otherwise collapsed and grossly normal stomach. Visualized small and large bowel is normal caliber, with no bowel wall thickening. Vascular/Lymphatic: Normal caliber abdominal aorta. No pathologically enlarged lymph nodes in the abdomen. Other: No pneumoperitoneum, ascites or focal fluid collection. No evidence of a retroperitoneal hematoma. Musculoskeletal: No aggressive appearing focal osseous lesions. Marked degenerative changes in the lumbar spine. IMPRESSION: 1. New central low-density filling defect in the right internal jugular vein, suspicious for acute deep venous thrombosis. Recommend correlation with right upper extremity venous Doppler scan. 2. New wedge shaped subpleural foci of patchy consolidation with surrounding ground-glass opacity in the right lower lobe. New patchy consolidation and ground-glass opacity in the lingula and left lower lobe. Findings are nonspecific and could represent multifocal pneumonia, however the possibility of pulmonary infarcts due to pulmonary embolism is not excluded. Correlate with chest CT angiography or V/Q scan as indicated. 3. Small left greater than right layering bilateral  pleural effusions, decreased on the right and increased on the left, with associated passive lower lobe atelectasis. 4. Stable findings of mild congestive heart failure including mild cardiomegaly and mild pulmonary edema. 5. Minimal pericardial fluid/thickening, slightly increased. 6. No acute abnormality in the abdomen. No evidence of a retroperitoneal hematoma. Tiny hiatal hernia. These results will be called to the ordering clinician or representative by the Radiologist Assistant, and communication documented in the PACS or zVision Dashboard. Electronically Signed   By: Delbert Phenix M.D.   On: 12/31/2015 09:17   Dg Chest 2 View  12/31/2015  CLINICAL DATA:  Pt having a VQ lung scan today. She is also having right sided chest pain today. Hx of fibromyalgia. Former smoker; quit 2014 EXAM: CHEST  2 VIEW COMPARISON:  Chest CT 12/31/2015, chest x-ray 12/27/2015 FINDINGS: Heart margins are obscured by bibasilar opacity. There are bilateral pleural effusions. Left lung base opacity consistent with atelectasis or infiltrate and appear stable. No evidence for pulmonary edema. Biapical pleural parenchymal thickening appears stable. IMPRESSION: Persistent significant opacity at the left lung base. Bilateral pleural effusions, left greater than right. Electronically Signed   By: Norva Pavlov M.D.   On: 12/31/2015 13:12   Dg Chest 2 View  12/27/2015  CLINICAL DATA:  Shortness of breath and chest pain for 2 months EXAM: CHEST  2 VIEW COMPARISON:  Chest CT December 20, 2015 FINDINGS: There is a sizable left pleural effusion with atelectasis/ consolidation in portions of the lingula and left lower lobe. There is a minimal right effusion. The right lung is otherwise clear. Heart is upper normal in size with pulmonary vascular within normal limits. No adenopathy. No bone lesions. IMPRESSION: Sizable left pleural effusion with atelectasis/ consolidation in portions of the lingula and left lower lobe. There is increased  opacity in this area compared to recent CT. Minimal right effusion. Right lung otherwise clear. No change in cardiac silhouette. Electronically Signed   By: Bretta Bang III M.D.   On: 12/27/2015 09:14   Ct Chest Without Contrast  12/31/2015  CLINICAL DATA:  Left chest and abdominal pain status post cardiac catheterization. CHF. EXAM: CT CHEST AND ABDOMEN WITHOUT CONTRAST TECHNIQUE: Multidetector CT imaging of the chest and abdomen was performed following the standard protocol without IV contrast. COMPARISON:  12/20/2015 chest CT . FINDINGS: CT CHEST Mediastinum/Nodes: Stable mild cardiomegaly. Minimal pericardial fluid/ thickening, slightly increased. Great vessels are normal in course and caliber. Stable hypodense 1.1 cm anterior right thyroid lobe nodule and 1.0 cm coarsely calcified anterior left thyroid lobe nodule. Normal esophagus. No pathologically enlarged axillary, mediastinal or gross hilar lymph nodes, noting limited sensitivity for the detection of hilar adenopathy on this noncontrast study. There is a new central low-density filling defect within the right internal jugular vein, suspicious for acute deep venous thrombosis. Lungs/Pleura: No pneumothorax. Small layering right pleural effusion, decreased since 12/19/2015. Small layering left pleural effusion, increased since 12/20/2015. There are new wedge-shaped subpleural focus of patchy consolidation with surrounding ground-glass opacity in the anterior right lower lobe (series 4/ image 43) and basilar right lower lobe (series 4/ image 59). There is new patchy consolidation and ground-glass opacity in the lingula and left lower lobe, with components of passive atelectasis in the lingula and left lower lobe. Mild interlobular septal thickening in the upper lobes likely represents mild pulmonary edema. Musculoskeletal: No aggressive appearing focal osseous lesions. Marked degenerative changes in the thoracic spine. CT ABDOMEN Hepatobiliary: There  is a 1.0 cm simple liver cyst in the segment 4B left liver lobe. There are at least 3 additional scattered subcentimeter hypodense lesions throughout the liver, too small to characterize. There is a subcapsular focus of low-density in the anterior medial segment left liver lobe adjacent to the intersegmental fissure, favor focal fat. Normal gallbladder with no radiopaque cholelithiasis. No biliary ductal dilatation. Pancreas: Normal, with no mass or duct dilation. Spleen: Normal size. No mass. Adrenals/Urinary Tract: Normal adrenals. There is symmetric excreted contrast in the bilateral renal collecting systems. No hydronephrosis. No renal mass. Stomach/Bowel: Tiny hiatal hernia. Otherwise collapsed and grossly normal stomach. Visualized small and large bowel is normal caliber, with no bowel wall thickening. Vascular/Lymphatic: Normal caliber abdominal aorta. No pathologically enlarged lymph nodes in the abdomen. Other: No pneumoperitoneum, ascites or  focal fluid collection. No evidence of a retroperitoneal hematoma. Musculoskeletal: No aggressive appearing focal osseous lesions. Marked degenerative changes in the lumbar spine. IMPRESSION: 1. New central low-density filling defect in the right internal jugular vein, suspicious for acute deep venous thrombosis. Recommend correlation with right upper extremity venous Doppler scan. 2. New wedge shaped subpleural foci of patchy consolidation with surrounding ground-glass opacity in the right lower lobe. New patchy consolidation and ground-glass opacity in the lingula and left lower lobe. Findings are nonspecific and could represent multifocal pneumonia, however the possibility of pulmonary infarcts due to pulmonary embolism is not excluded. Correlate with chest CT angiography or V/Q scan as indicated. 3. Small left greater than right layering bilateral pleural effusions, decreased on the right and increased on the left, with associated passive lower lobe atelectasis. 4.  Stable findings of mild congestive heart failure including mild cardiomegaly and mild pulmonary edema. 5. Minimal pericardial fluid/thickening, slightly increased. 6. No acute abnormality in the abdomen. No evidence of a retroperitoneal hematoma. Tiny hiatal hernia. These results will be called to the ordering clinician or representative by the Radiologist Assistant, and communication documented in the PACS or zVision Dashboard. Electronically Signed   By: Delbert Phenix M.D.   On: 12/31/2015 09:17   Ct Chest Wo Contrast  12/20/2015  CLINICAL DATA:  Coursing short of breath.  Cough 1/2 weeks. EXAM: CT CHEST WITHOUT CONTRAST TECHNIQUE: Multidetector CT imaging of the chest was performed following the standard protocol without IV contrast. COMPARISON:  None. FINDINGS: Mediastinum/Nodes: No axillary supraclavicular adenopathy. Mild thyroid nodularity with calcification. No mediastinal hilar or adenopathy. No pericardial fluid. Heart appears enlarged. Esophagus is normal. Lungs/Pleura: Bilateral moderate pleural effusions. No pulmonary edema or interlobular septal thickening mild basilar atelectasis. Airways are normal. Upper abdomen: Several low-attenuation lesions within the liver are likely benign but cannot be fully characterized (image 68 series 2) focal fatty infiltration along the falciform ligament. Normal adrenal glands. Musculoskeletal: Degenerate spurring of spine at multiple levels. Endplate narrowing. IMPRESSION: 1. Cardiomegaly and bilateral pleural effusions suggest mild heart failure. 2. No edema or pulmonary infection. Electronically Signed   By: Genevive Bi M.D.   On: 12/20/2015 10:25   Nm Pulmonary Perf And Vent  12/31/2015  CLINICAL DATA:  Chest pain at rest. EXAM: NUCLEAR MEDICINE VENTILATION - PERFUSION LUNG SCAN TECHNIQUE: Ventilation images were obtained in multiple projections using inhaled aerosol Tc-104m DTPA. Perfusion images were obtained in multiple projections after intravenous  injection of Tc-30m MAA. RADIOPHARMACEUTICALS:  32.46 millicuries of Technetium-98m DTPA aerosol inhalation and 3.92 millicuries of Technetium-61m MAA IV COMPARISON:  Chest x-ray dated 12/31/2015 FINDINGS: Ventilation: There is decreased perfusion at the left lung base due to the large effusion seen on chest x-ray. The ventilation exam is otherwise normal. Perfusion: There is slightly decreased profusion at the left lung base due to the effusion. There is a wedge-shaped perfusion defect at the right lung base which is felt to be due to fluid along the major fissure due to the small right effusion. IMPRESSION: No findings suggestive of pulmonary embolism. Bilateral pleural effusions, left more than right. Electronically Signed   By: Francene Boyers M.D.   On: 12/31/2015 14:44   US Venous Img Upper Uni Right  12/31/2015  CLINICAL DATA:  DVT. EXAM: RIGHT UPPER EXTREMITY VENOUS DOPPLER ULTRASOUND TECHNIQUE: Gray-scale sonography with graded compression, as well as color Doppler and duplex ultrasound were performed to evaluate the upper extremity deep venous system from the level of the subclavian vein and including the  jugular, axillary, basilic, radial, ulnar and upper cephalic vein. Spectral Doppler was utilized to evaluate flow at rest and with distal augmentation maneuvers. COMPARISON:  CT 12/31/2015 FINDINGS: Contralateral Subclavian Vein: Respiratory phasicity is normal and symmetric with the symptomatic side. No evidence of thrombus. Normal compressibility. Internal Jugular Vein: Occlusive noncompressible thrombus. Subclavian Vein: Occlusive noncompressible thrombus. Axillary Vein: No evidence of thrombus. Normal compressibility, respiratory phasicity and response to augmentation. Cephalic Vein: No evidence of thrombus. Normal compressibility, respiratory phasicity and response to augmentation. Basilic Vein: Near occlusive thrombus with decreased compressibility. Brachial Veins: No evidence of thrombus. Normal  compressibility, respiratory phasicity and response to augmentation. Radial Veins: No evidence of thrombus. Normal compressibility, respiratory phasicity and response to augmentation. Ulnar Veins: No evidence of thrombus. Normal compressibility, respiratory phasicity and response to augmentation. Other Findings:  None visualized. IMPRESSION: Positive study for occlusive thrombosis of the right internal jugular vein and right subclavian vein . Near occlusive thrombus is noted in the basilic vein. These results will be called to the ordering clinician or representative by the Radiologist Assistant, and communication documented in the PACS or zVision Dashboard. Electronically Signed   By: Maisie Fus  Register   On: 12/31/2015 12:05    ASSESSMENT: DVT and PE with possible factor V Leiden.  PLAN:    1. DVT/PE: Imaging results reviewed independently and reported as above.  Patient also reports her mother recently passed away with a "massive blood clots" and was found to have factor V Leiden deficiency.  Have ordered factor V Leiden mutation to confirm diagnosis. If positive, patient will likely require lifelong anticoagulation with either Eliquis or Xarelto. If her factor V Leiden is negative, will initiate full hypercoagulable workup for further evaluation.  Appreciate consult, will follow.   Jeralyn Ruths, MD   01/01/2016 3:34 PM

## 2016-01-01 NOTE — Care Management (Signed)
Met briefly with patient to see if she need RNCM for discharge planning. She states her PCP is Dr. Terri Piedra at [facility]. She states she is independent with daily activities and drives. She denies difficulty obtaining Rx. She is from home alone and denies any concerns. No identified RNCM needs.

## 2016-01-01 NOTE — Progress Notes (Signed)
ANTICOAGULATION CONSULT NOTE - Initial Consult  Pharmacy Consult for Apixaban Indication: DVT  Allergies  Allergen Reactions  . Latex Dermatitis    Patient Measurements: Height:  (175.3 cm) Weight: 169 lb (76.658 kg) IBW/kg (Calculated) : 66.2   Vital Signs: Temp: 98.3 F (36.8 C) (01/31 1158) Temp Source: Oral (01/31 1158) BP: 95/59 mmHg (01/31 1158) Pulse Rate: 92 (01/31 1158)  Labs:  Recent Labs  12/30/15 1610 12/31/15 0509 12/31/15 1214 12/31/15 1826 01/01/16 0213 01/01/16 0803  HGB  --  13.3  --   --  13.0  --   HCT  --  41.4  --   --  40.6  --   PLT  --  412  --   --  405  --   APTT  --   --  26  --   --   --   LABPROT  --  15.5*  --   --   --   --   INR  --  1.21  --   --   --   --   HEPARINUNFRC  --   --   --  0.24* 0.69 0.12*  CREATININE 1.05* 1.11*  --   --   --   --     Estimated Creatinine Clearance: 57.7 mL/min (by C-G formula based on Cr of 1.11).   Assessment: 60 yo female transitioning from IV heparin to Eliquis for extensive DVT (no PE per MD).   Goal of Therapy:  Monitor platelets by anticoagulation protocol: Yes   Plan:  Will order apixaban 10 mg PO BID x7 days then 5 mg PO BID D/c heparin drip and heparin consult CBC every 3 days  Crist Fat L 01/01/2016,3:57 PM

## 2016-01-01 NOTE — Progress Notes (Signed)
ANTICOAGULATION CONSULT NOTE - Follow Up Consult  Pharmacy Consult for Heparin drip Indication: pulmonary embolus and DVT  Allergies  Allergen Reactions  . Latex Dermatitis    Patient Measurements: Height:  (175.3 cm) Weight: 168 lb 1.6 oz (76.25 kg) IBW/kg (Calculated) : 66.2 Heparin Dosing Weight: 76.3 kg  Vital Signs: Temp: 99.8 F (37.7 C) (01/30 1948) Temp Source: Oral (01/30 1948) BP: 100/53 mmHg (01/30 2033) Pulse Rate: 79 (01/30 2033)  Labs:  Recent Labs  12/29/15 0447 12/30/15 0981 12/31/15 0509 12/31/15 1214 12/31/15 1826 01/01/16 0213  HGB  --   --  13.3  --   --  13.0  HCT  --   --  41.4  --   --  40.6  PLT  --   --  412  --   --  405  APTT  --   --   --  26  --   --   LABPROT  --   --  15.5*  --   --   --   INR  --   --  1.21  --   --   --   HEPARINUNFRC  --   --   --   --  0.24* 0.69  CREATININE 0.84 1.05* 1.11*  --   --   --     Estimated Creatinine Clearance: 57.7 mL/min (by C-G formula based on Cr of 1.11).   Medical History: Past Medical History  Diagnosis Date  . Depression   . DDD (degenerative disc disease), cervical   . Anxiety   . Fibromyalgia     Medications:  Infusions:  . heparin 1,450 Units/hr (12/31/15 1953)    Assessment: Patient is a 60yo female admitted for CHF. Now with SOB and chest discomfort. CT shows possible PE/DVT. Pharmacy consulted to dose Heparin infusion without a bolus.  Heparin drip at current rate of 1300 units/hr with subtherapeutic HL.  1/30 Hgb=13.3, Plt=412, INR=1.21 1/30 1826 HL=0.24  Goal of Therapy:  Heparin level 0.3-0.7 units/ml Monitor platelets by anticoagulation protocol: Yes   Plan:  Heparin level therapeutic x 1. Continue current rate, will recheck in 6 hours.   Crystal [place], [place].D., BCPS Clinical Pharmacist 01/01/2016

## 2016-01-01 NOTE — Progress Notes (Signed)
[facility] [place] [facility]   PATIENT NAME: Maureen Grant    MR#:  [id number]  DATE OF BIRTH:  1956-08-10  SUBJECTIVE:  CHIEF COMPLAINT:   Chief Complaint  Patient presents with  . Shortness of Breath   patient is a 60 year old female who presents to the hospital with complaints of shortness of breath, orthopnea, cough of some right-sided chest pains.  Today patient denies any chest pain or shortness of breath while resting. Cough is improving. Mom has history of factor V Leiden deficiency with mutation and passed away recently with a big clot in her abdomen.  Review of Systems  Constitutional: Negative for fever, chills and weight loss.  HENT: Negative for congestion.   Eyes: Negative for blurred vision and double vision.  Respiratory: Positive for cough. Negative for sputum production, shortness of breath and wheezing.   Cardiovascular: Negative for chest pain, palpitations, orthopnea, leg swelling and PND.  Gastrointestinal: Negative for nausea, vomiting, abdominal pain, diarrhea, constipation and blood in stool.  Genitourinary: Negative for dysuria, urgency, frequency and hematuria.  Musculoskeletal: Negative for falls.  Neurological: Negative for dizziness, tremors, focal weakness and headaches.  Endo/Heme/Allergies: Does not bruise/bleed easily.  Psychiatric/Behavioral: Negative for depression. The patient does not have insomnia.     VITAL SIGNS: Blood pressure 95/59, pulse 92, temperature 98.3 F (36.8 C), temperature source Oral, resp. rate 20, height  (1.753 m), weight 76.658 kg (169 lb), SpO2 91 %.  PHYSICAL EXAMINATION:   GENERAL:  60 y.o.-year-old patient sitting in the bed with no acute distress.  EYES: Pupils equal, round, reactive to light and accommodation. No scleral icterus. Extraocular muscles intact.  HEENT: Head atraumatic, normocephalic. Oropharynx and nasopharynx clear.  NECK:  Supple, no jugular venous distention. No  thyroid enlargement, no tenderness.  LUNGS: Diminished breath sounds on the left, primarily at base, no wheezing, few rales,rhonchi , no crepitation. Not using accessory muscles of respiration, able to speak in longer sentences.  CARDIOVASCULAR: S1, S2 normal. No murmurs, rubs, or gallops. Right anterior chest wall tenderness is present ABDOMEN: Soft, nontender, nondistended. Bowel sounds present. No organomegaly or mass.  EXTREMITIES: No pedal edema, cyanosis, or clubbing.  NEUROLOGIC: Cranial nerves II through XII are intact. Muscle strength 5/5 in all extremities. Sensation intact. Gait not checked.  PSYCHIATRIC: The patient is alert and oriented x 3.  SKIN: No obvious rash, lesion, or ulcer.   ORDERS/RESULTS REVIEWED:   CBC  Recent Labs Lab 12/27/15 0857 12/28/15 0426 12/31/15 0509 01/01/16 0213  WBC 13.2* 10.5 7.9 8.4  HGB 12.8 13.0 13.3 13.0  HCT 40.5 40.4 41.4 40.6  PLT 372 356 412 405  MCV 86.9 85.8 84.4 84.6  MCH 27.6 27.7 27.2 27.1  MCHC 31.7* 32.3 32.2 32.0  RDW 15.3* 15.0* 14.7* 15.3*  LYMPHSABS 1.4  --   --   --   MONOABS 1.1*  --   --   --   EOSABS 0.0  --   --   --   BASOSABS 0.0  --   --   --    ------------------------------------------------------------------------------------------------------------------  Chemistries   Recent Labs Lab 12/27/15 0857 12/28/15 0426 12/29/15 0447 12/30/15 0658 12/31/15 0509  NA 137 140 138 136 137  K 3.2* 4.8 3.5 5.0 4.8  CL 99* 99* 100* 98* 98*  CO2 32 33*  GLUCOSE 133* 105* 95 101* 104*  BUN CREATININE 1.05* 1.01* 0.84 1.05* 1.11*  CALCIUM 8.7*  8.8* 8.4* 8.6* 8.6*  MG  --   --   --  1.9  --    ------------------------------------------------------------------------------------------------------------------ estimated creatinine clearance is 57.7 mL/min (by C-G formula based on Cr of  1.11). ------------------------------------------------------------------------------------------------------------------ No results for input(s): TSH, T4TOTAL, T3FREE, THYROIDAB in the last 72 hours.  Invalid input(s): FREET3  Cardiac Enzymes  Recent Labs Lab 12/27/15 0857 12/27/15 1917 12/28/15 0105  TROPONINI 0.14* 0.15* 0.12*   ------------------------------------------------------------------------------------------------------------------ Invalid input(s): POCBNP ---------------------------------------------------------------------------------------------------------------  RADIOLOGY: Ct Abdomen Wo Contrast  12/31/2015  CLINICAL DATA:  Left chest and abdominal pain status post cardiac catheterization. CHF. EXAM: CT CHEST AND ABDOMEN WITHOUT CONTRAST TECHNIQUE: Multidetector CT imaging of the chest and abdomen was performed following the standard protocol without IV contrast. COMPARISON:  12/20/2015 chest CT . FINDINGS: CT CHEST Mediastinum/Nodes: Stable mild cardiomegaly. Minimal pericardial fluid/ thickening, slightly increased. Great vessels are normal in course and caliber. Stable hypodense 1.1 cm anterior right thyroid lobe nodule and 1.0 cm coarsely calcified anterior left thyroid lobe nodule. Normal esophagus. No pathologically enlarged axillary, mediastinal or gross hilar lymph nodes, noting limited sensitivity for the detection of hilar adenopathy on this noncontrast study. There is a new central low-density filling defect within the right internal jugular vein, suspicious for acute deep venous thrombosis. Lungs/Pleura: No pneumothorax. Small layering right pleural effusion, decreased since 12/19/2015. Small layering left pleural effusion, increased since 12/20/2015. There are new wedge-shaped subpleural focus of patchy consolidation with surrounding ground-glass opacity in the anterior right lower lobe (series 4/ image 43) and basilar right lower lobe (series 4/ image 59). There is  new patchy consolidation and ground-glass opacity in the lingula and left lower lobe, with components of passive atelectasis in the lingula and left lower lobe. Mild interlobular septal thickening in the upper lobes likely represents mild pulmonary edema. Musculoskeletal: No aggressive appearing focal osseous lesions. Marked degenerative changes in the thoracic spine. CT ABDOMEN Hepatobiliary: There is a 1.0 cm simple liver cyst in the segment 4B left liver lobe. There are at least 3 additional scattered subcentimeter hypodense lesions throughout the liver, too small to characterize. There is a subcapsular focus of low-density in the anterior medial segment left liver lobe adjacent to the intersegmental fissure, favor focal fat. Normal gallbladder with no radiopaque cholelithiasis. No biliary ductal dilatation. Pancreas: Normal, with no mass or duct dilation. Spleen: Normal size. No mass. Adrenals/Urinary Tract: Normal adrenals. There is symmetric excreted contrast in the bilateral renal collecting systems. No hydronephrosis. No renal mass. Stomach/Bowel: Tiny hiatal hernia. Otherwise collapsed and grossly normal stomach. Visualized small and large bowel is normal caliber, with no bowel wall thickening. Vascular/Lymphatic: Normal caliber abdominal aorta. No pathologically enlarged lymph nodes in the abdomen. Other: No pneumoperitoneum, ascites or focal fluid collection. No evidence of a retroperitoneal hematoma. Musculoskeletal: No aggressive appearing focal osseous lesions. Marked degenerative changes in the lumbar spine. IMPRESSION: 1. New central low-density filling defect in the right internal jugular vein, suspicious for acute deep venous thrombosis. Recommend correlation with right upper extremity venous Doppler scan. 2. New wedge shaped subpleural foci of patchy consolidation with surrounding ground-glass opacity in the right lower lobe. New patchy consolidation and ground-glass opacity in the lingula and  left lower lobe. Findings are nonspecific and could represent multifocal pneumonia, however the possibility of pulmonary infarcts due to pulmonary embolism is not excluded. Correlate with chest CT angiography or V/Q scan as indicated. 3. Small left greater than right layering bilateral pleural effusions, decreased on the right and increased on the left, with  associated passive lower lobe atelectasis. 4. Stable findings of mild congestive heart failure including mild cardiomegaly and mild pulmonary edema. 5. Minimal pericardial fluid/thickening, slightly increased. 6. No acute abnormality in the abdomen. No evidence of a retroperitoneal hematoma. Tiny hiatal hernia. These results will be called to the ordering clinician or representative by the Radiologist Assistant, and communication documented in the PACS or zVision Dashboard. Electronically Signed   By: Delbert Phenix M.D.   On: 12/31/2015 09:17   Dg Chest 2 View  12/31/2015  CLINICAL DATA:  Pt having a VQ lung scan today. She is also having right sided chest pain today. Hx of fibromyalgia. Former smoker; quit 2014 EXAM: CHEST  2 VIEW COMPARISON:  Chest CT 12/31/2015, chest x-ray 12/27/2015 FINDINGS: Heart margins are obscured by bibasilar opacity. There are bilateral pleural effusions. Left lung base opacity consistent with atelectasis or infiltrate and appear stable. No evidence for pulmonary edema. Biapical pleural parenchymal thickening appears stable. IMPRESSION: Persistent significant opacity at the left lung base. Bilateral pleural effusions, left greater than right. Electronically Signed   By: Norva Pavlov M.D.   On: 12/31/2015 13:12   Ct Chest Without Contrast  12/31/2015  CLINICAL DATA:  Left chest and abdominal pain status post cardiac catheterization. CHF. EXAM: CT CHEST AND ABDOMEN WITHOUT CONTRAST TECHNIQUE: Multidetector CT imaging of the chest and abdomen was performed following the standard protocol without IV contrast. COMPARISON:   12/20/2015 chest CT . FINDINGS: CT CHEST Mediastinum/Nodes: Stable mild cardiomegaly. Minimal pericardial fluid/ thickening, slightly increased. Great vessels are normal in course and caliber. Stable hypodense 1.1 cm anterior right thyroid lobe nodule and 1.0 cm coarsely calcified anterior left thyroid lobe nodule. Normal esophagus. No pathologically enlarged axillary, mediastinal or gross hilar lymph nodes, noting limited sensitivity for the detection of hilar adenopathy on this noncontrast study. There is a new central low-density filling defect within the right internal jugular vein, suspicious for acute deep venous thrombosis. Lungs/Pleura: No pneumothorax. Small layering right pleural effusion, decreased since 12/19/2015. Small layering left pleural effusion, increased since 12/20/2015. There are new wedge-shaped subpleural focus of patchy consolidation with surrounding ground-glass opacity in the anterior right lower lobe (series 4/ image 43) and basilar right lower lobe (series 4/ image 59). There is new patchy consolidation and ground-glass opacity in the lingula and left lower lobe, with components of passive atelectasis in the lingula and left lower lobe. Mild interlobular septal thickening in the upper lobes likely represents mild pulmonary edema. Musculoskeletal: No aggressive appearing focal osseous lesions. Marked degenerative changes in the thoracic spine. CT ABDOMEN Hepatobiliary: There is a 1.0 cm simple liver cyst in the segment 4B left liver lobe. There are at least 3 additional scattered subcentimeter hypodense lesions throughout the liver, too small to characterize. There is a subcapsular focus of low-density in the anterior medial segment left liver lobe adjacent to the intersegmental fissure, favor focal fat. Normal gallbladder with no radiopaque cholelithiasis. No biliary ductal dilatation. Pancreas: Normal, with no mass or duct dilation. Spleen: Normal size. No mass. Adrenals/Urinary Tract:  Normal adrenals. There is symmetric excreted contrast in the bilateral renal collecting systems. No hydronephrosis. No renal mass. Stomach/Bowel: Tiny hiatal hernia. Otherwise collapsed and grossly normal stomach. Visualized small and large bowel is normal caliber, with no bowel wall thickening. Vascular/Lymphatic: Normal caliber abdominal aorta. No pathologically enlarged lymph nodes in the abdomen. Other: No pneumoperitoneum, ascites or focal fluid collection. No evidence of a retroperitoneal hematoma. Musculoskeletal: No aggressive appearing focal osseous lesions. Marked degenerative changes  in the lumbar spine. IMPRESSION: 1. New central low-density filling defect in the right internal jugular vein, suspicious for acute deep venous thrombosis. Recommend correlation with right upper extremity venous Doppler scan. 2. New wedge shaped subpleural foci of patchy consolidation with surrounding ground-glass opacity in the right lower lobe. New patchy consolidation and ground-glass opacity in the lingula and left lower lobe. Findings are nonspecific and could represent multifocal pneumonia, however the possibility of pulmonary infarcts due to pulmonary embolism is not excluded. Correlate with chest CT angiography or V/Q scan as indicated. 3. Small left greater than right layering bilateral pleural effusions, decreased on the right and increased on the left, with associated passive lower lobe atelectasis. 4. Stable findings of mild congestive heart failure including mild cardiomegaly and mild pulmonary edema. 5. Minimal pericardial fluid/thickening, slightly increased. 6. No acute abnormality in the abdomen. No evidence of a retroperitoneal hematoma. Tiny hiatal hernia. These results will be called to the ordering clinician or representative by the Radiologist Assistant, and communication documented in the PACS or zVision Dashboard. Electronically Signed   By: Delbert Phenix M.D.   On: 12/31/2015 09:17   Nm Pulmonary  Perf And Vent  12/31/2015  CLINICAL DATA:  Chest pain at rest. EXAM: NUCLEAR MEDICINE VENTILATION - PERFUSION LUNG SCAN TECHNIQUE: Ventilation images were obtained in multiple projections using inhaled aerosol Tc-70m DTPA. Perfusion images were obtained in multiple projections after intravenous injection of Tc-21m MAA. RADIOPHARMACEUTICALS:  32.46 millicuries of Technetium-69m DTPA aerosol inhalation and 3.92 millicuries of Technetium-28m MAA IV COMPARISON:  Chest x-ray dated 12/31/2015 FINDINGS: Ventilation: There is decreased perfusion at the left lung base due to the large effusion seen on chest x-ray. The ventilation exam is otherwise normal. Perfusion: There is slightly decreased profusion at the left lung base due to the effusion. There is a wedge-shaped perfusion defect at the right lung base which is felt to be due to fluid along the major fissure due to the small right effusion. IMPRESSION: No findings suggestive of pulmonary embolism. Bilateral pleural effusions, left more than right. Electronically Signed   By: Francene Boyers M.D.   On: 12/31/2015 14:44   US Venous Img Upper Uni Right  12/31/2015  CLINICAL DATA:  DVT. EXAM: RIGHT UPPER EXTREMITY VENOUS DOPPLER ULTRASOUND TECHNIQUE: Gray-scale sonography with graded compression, as well as color Doppler and duplex ultrasound were performed to evaluate the upper extremity deep venous system from the level of the subclavian vein and including the jugular, axillary, basilic, radial, ulnar and upper cephalic vein. Spectral Doppler was utilized to evaluate flow at rest and with distal augmentation maneuvers. COMPARISON:  CT 12/31/2015 FINDINGS: Contralateral Subclavian Vein: Respiratory phasicity is normal and symmetric with the symptomatic side. No evidence of thrombus. Normal compressibility. Internal Jugular Vein: Occlusive noncompressible thrombus. Subclavian Vein: Occlusive noncompressible thrombus. Axillary Vein: No evidence of thrombus. Normal  compressibility, respiratory phasicity and response to augmentation. Cephalic Vein: No evidence of thrombus. Normal compressibility, respiratory phasicity and response to augmentation. Basilic Vein: Near occlusive thrombus with decreased compressibility. Brachial Veins: No evidence of thrombus. Normal compressibility, respiratory phasicity and response to augmentation. Radial Veins: No evidence of thrombus. Normal compressibility, respiratory phasicity and response to augmentation. Ulnar Veins: No evidence of thrombus. Normal compressibility, respiratory phasicity and response to augmentation. Other Findings:  None visualized. IMPRESSION: Positive study for occlusive thrombosis of the right internal jugular vein and right subclavian vein . Near occlusive thrombus is noted in the basilic vein. These results will be called to the ordering clinician or  representative by the Radiologist Assistant, and communication documented in the PACS or zVision Dashboard. Electronically Signed   By: Maisie Fus  Register   On: 12/31/2015 12:05    EKG:  Orders placed or performed during the hospital encounter of 12/27/15  . ED EKG  . ED EKG  . EKG 12-Lead  . EKG 12-Lead  . ED EKG  . ED EKG  . EKG 12-Lead  . EKG 12-Lead    ASSESSMENT AND PLAN:  Active Problems:   CHF (congestive heart failure) (HCC)  # Acute  right IJ , right subclavian vein -occlusive DVT Patient is started on heparin drip without bolus as patient just had cardiac catheterization done. Will transit the patient to Eliquis and discontinue heparin. Patient probably needs lifetime anticoagulation Coagulation workup per oncology, discussed with Dr. Orlie Dakin. He is aware the patient has a family history of factor V Leiden deficiency with mutation Outpatient follow-up with oncology as recommended VQ scan is ordered which is low probability for pulmonary embolism   #Multilobar pneumonia-probably hospital-acquired Pleuritic chest pain resolved Started  the patient on IV Zosyn and levofloxacin, clinically improving will consider changing the IV to by mouth antibiotics in a.m. 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 Continue metoprolol and add ARB to her regimen  #. Renal insufficiency, stable, following closely with diuresis  #. 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 Elevated troponin, Myoview stress-12/28/15 has revealed multivessel coronary artery disease  #Persistent nausea-Zofran as needed and PPI   Management plans discussed with the patient, family and they are in agreement.   DRUG ALLERGIES:  Allergies  Allergen Reactions  . Latex Dermatitis    CODE STATUS:     Code Status Orders        Start     Ordered   12/27/15 1036  Full code   Continuous     12/27/15 1036    Code Status History    Date Active Date Inactive Code Status Order ID Comments User Context   This patient has a current code status but no historical code status.      TOTAL TIME TAKING CARE OF THIS PATIENT: 35 minutes.  Possible discharge in a.m. if clinically stable   Ramonita Lab M.D on 01/01/2016 at 3:46 PM  Between 7am to 6pm - Pager - [phone]  After 6pm go to [email or web address] - password [email or web address] [facility]  [place] [facility]  Office  [phone]  CC: Primary care physician; Margaretann Loveless, PA-C

## 2016-01-01 NOTE — Progress Notes (Signed)
ANTICOAGULATION CONSULT NOTE - Follow Up Consult  Pharmacy Consult for Heparin drip Indication: pulmonary embolus and DVT  Allergies  Allergen Reactions  . Latex Dermatitis    Patient Measurements: Height:  (175.3 cm) Weight: 169 lb (76.658 kg) IBW/kg (Calculated) : 66.2 Heparin Dosing Weight: 76.7 kg  Vital Signs: Temp: 98.9 F (37.2 C) (01/31 0441) Temp Source: Oral (01/31 0441) BP: 103/63 mmHg (01/31 0758) Pulse Rate: 91 (01/31 0758)  Labs:  Recent Labs  12/30/15 8469 12/31/15 0509 12/31/15 1214 12/31/15 1826 01/01/16 0213 01/01/16 0803  HGB  --  13.3  --   --  13.0  --   HCT  --  41.4  --   --  40.6  --   PLT  --  412  --   --  405  --   APTT  --   --  26  --   --   --   LABPROT  --  15.5*  --   --   --   --   INR  --  1.21  --   --   --   --   HEPARINUNFRC  --   --   --  0.24* 0.69 0.12*  CREATININE 1.05* 1.11*  --   --   --   --     Estimated Creatinine Clearance: 57.7 mL/min (by C-G formula based on Cr of 1.11).  Assessment: Patient is a 60yo female admitted for CHF. Now with SOB and chest discomfort. CT shows possible PE/DVT. Pharmacy consulted to dose Heparin infusion without a bolus.  Heparin drip at current rate of 1450 units/hr with subtherapeutic HL.  Per RN, no issues with the line and no s/sx of bleeding. RN confirms heparin drip running at 14.5 ml/hr.   1/30 Hgb=13.3, Plt=412, INR=1.21 1/31 Hgb 13.0, Plt 405 1/31 0803 HL 0.12, previous level 0.69  Goal of Therapy:  Heparin level 0.3-0.7 units/ml Monitor platelets by anticoagulation protocol: Yes   Plan:  Will increase heparin drip to 1650 units/hr (=16.5 ml/hr) Anti-Xa level in 6h CBC in AM  Crist Fat, PharmD, BCPS Clinical Pharmacist 01/01/2016 9:48 AM

## 2016-01-02 LAB — CBC
HEMATOCRIT: 39.8 % (ref 35.0–47.0)
Hemoglobin: 12.9 g/dL (ref 12.0–16.0)
MCH: 27.7 pg (ref 26.0–34.0)
MCHC: 32.4 g/dL (ref 32.0–36.0)
MCV: 85.4 fL (ref 80.0–100.0)
PLATELETS: 399 10*3/uL (ref 150–440)
RBC: 4.66 MIL/uL (ref 3.80–5.20)
RDW: 14.9 % — AB (ref 11.5–14.5)
WBC: 8.4 10*3/uL (ref 3.6–11.0)

## 2016-01-02 MED ORDER — POTASSIUM CHLORIDE CRYS ER 20 MEQ PO TBCR: 40.0000 meq | EXTENDED_RELEASE_TABLET | Freq: Every day | ORAL | Status: DC

## 2016-01-02 MED ORDER — APIXABAN 5 MG PO TABS: 10.0000 mg | ORAL_TABLET | Freq: Two times a day (BID) | ORAL | Status: DC

## 2016-01-02 MED ORDER — CARVEDILOL 3.125 MG PO TABS: 3.1250 mg | ORAL_TABLET | Freq: Two times a day (BID) | ORAL | Status: DC

## 2016-01-02 MED ORDER — LEVOFLOXACIN 750 MG PO TABS: 750.0000 mg | ORAL_TABLET | Freq: Every day | ORAL | Status: DC

## 2016-01-02 MED ORDER — FUROSEMIDE 40 MG PO TABS: 40.0000 mg | ORAL_TABLET | Freq: Two times a day (BID) | ORAL | Status: DC

## 2016-01-02 MED ORDER — ASPIRIN 81 MG PO TBEC: 81.0000 mg | DELAYED_RELEASE_TABLET | Freq: Every day | ORAL | Status: DC

## 2016-01-02 MED ORDER — ACETAMINOPHEN 325 MG PO TABS: 650.0000 mg | ORAL_TABLET | ORAL | Status: DC | PRN

## 2016-01-02 MED ORDER — FUROSEMIDE 40 MG PO TABS: 40.0000 mg | ORAL_TABLET | Freq: Every day | ORAL | Status: DC

## 2016-01-02 MED ORDER — DIPHENHYDRAMINE HCL 25 MG PO CAPS: 25.0000 mg | ORAL_CAPSULE | Freq: Every evening | ORAL | Status: DC | PRN

## 2016-01-02 NOTE — Progress Notes (Signed)
Discharge instructions given. IV's and tele removed. Prescriptions given to patient as well as education on new medications. Patient will call and make her appointments, already set up with heart failure clinic. Educated on heart failure as well as angiogram aftercare. No further questions at this time. Sister is here to take patient home.

## 2016-01-02 NOTE — Discharge Instructions (Signed)
Heart Failure Clinic appointment on January 15, 2016 at 9:00am with Clarisa Kindred, FNP. Please call [phone] to reschedule.   Activity as tolerated Diet healthy heart Follow-up with primary care physician in a week Monitor daily weights Follow-up with cardiology in a week Follow-up with oncology in a week Continue anticoagulation

## 2016-01-02 NOTE — Care Management Note (Signed)
Case Management Note  Patient Details  Name: Maureen Grant MRN: [id number] Date of Birth: 06-30-1956  Subjective/Objective:    Provide patient with a Eliquis discount card.                 Action/Plan: No needs noted. Case closed  Expected Discharge Date:    01/02/2016              Expected Discharge Plan:  Home/Self Care  In-House Referral:     Discharge planning Services  CM Consult, Homebound not met per provider  Post Acute Care Choice:    Choice offered to:     DME Arranged:    DME Agency:     HH Arranged:    HH Agency:     Status of Service:  Completed, signed off  Medicare Important Message Given:    Date Medicare IM Given:    Medicare IM give by:    Date Additional Medicare IM Given:    Additional Medicare Important Message give by:     If discussed at [place] of Stay Meetings, dates discussed:    Additional Comments:  Jolly Mango, RN 01/02/2016, 11:38 AM

## 2016-01-02 NOTE — Progress Notes (Signed)
Discussed patient's blood pressure meds with Dr. Joana Reamer. Notified MD that patient's blood pressure has been low and there were a couple of times over the last few days patient did get her BP meds and pressure stayed around 100's/50's. When patient received meds, pressure dropped over night. MD stated to discontinue metoprolol and losartan and start patient on 3.125 of coreg.

## 2016-01-02 NOTE — Progress Notes (Signed)
ANTICOAGULATION CONSULT NOTE - follow up  Pharmacy Consult for Apixaban Indication: DVT  Allergies  Allergen Reactions  . Latex Dermatitis    Patient Measurements: Height:  (175.3 cm) Weight: 170 lb 6.4 oz (77.293 kg) IBW/kg (Calculated) : 66.2   Vital Signs: Temp: 98.7 F (37.1 C) (02/01 1114) Temp Source: Oral (02/01 1114) BP: 103/62 mmHg (02/01 1114) Pulse Rate: 98 (02/01 1114)  Labs:  Recent Labs  12/31/15 0509 12/31/15 1214 12/31/15 1826 01/01/16 0213 01/01/16 0803 01/02/16 0744  HGB 13.3  --   --  13.0  --  12.9  HCT 41.4  --   --  40.6  --  39.8  PLT 412  --   --  405  --  399  APTT  --  26  --   --   --   --   LABPROT 15.5*  --   --   --   --   --   INR 1.21  --   --   --   --   --   HEPARINUNFRC  --   --  0.24* 0.69 0.12*  --   CREATININE 1.11*  --   --   --   --   --     Estimated Creatinine Clearance: 57.7 mL/min (by C-G formula based on Cr of 1.11).   Assessment: 60 yo female transitioning from IV heparin to Eliquis for extensive DVT (no PE per MD).   Goal of Therapy:  Monitor platelets by anticoagulation protocol: Yes   Plan:  Will order apixaban 10 mg PO BID x7 days then 5 mg PO BID D/c heparin drip and heparin consult CBC every 3 days  Anita Mcadory A 01/02/2016,11:23 AM

## 2016-01-02 NOTE — Progress Notes (Signed)
PHARMACIST - PHYSICIAN COMMUNICATION  CONCERNING: Antibiotic IV to Oral Route Change Policy  RECOMMENDATION: This patient is receiving LEVOFLOXACIN by the intravenous route.  Based on criteria approved by the Pharmacy and Therapeutics Committee, the antibiotic(s) is/are being converted to the equivalent oral dose form(s).   DESCRIPTION: These criteria include:  Patient being treated for a respiratory tract infection, urinary tract infection, cellulitis or clostridium difficile associated diarrhea if on metronidazole  The patient is not neutropenic and does not exhibit a GI malabsorption state  The patient is eating (either orally or via tube) and/or has been taking other orally administered medications for a least 24 hours  The patient is improving clinically and has a Tmax < 100.5  If you have questions about this conversion, please contact the Pharmacy Department    [phone] )  [facility]   [phone] )  [facility]   [phone] )  [facility]   [phone] )  [facility]   [phone] )  [facility]

## 2016-01-02 NOTE — Discharge Summary (Signed)
[facility] - [place] at [facility]   PATIENT NAME: Maureen Grant    MR#:  [id number]  DATE OF BIRTH:  07-May-1956  DATE OF ADMISSION:  12/27/2015 ADMITTING PHYSICIAN: Milagros Loll, MD  DATE OF DISCHARGE: 01/02/2016  PRIMARY CARE PHYSICIAN: Margaretann Loveless, PA-C    ADMISSION DIAGNOSIS:  Dyspnea [R06.00] Pleural effusion [J90] Elevated troponin I level [R79.89] Chest pain [R07.9] Acute congestive heart failure, unspecified congestive heart failure type (HCC) [I50.9]  DISCHARGE DIAGNOSIS:  Active Problems:   CHF (congestive heart failure) (HCC)  acute right internal jugular and subclavian DVT Multivessel coronary artery disease with cardiomyopathy  SECONDARY DIAGNOSIS:   Past Medical History  Diagnosis Date  . Depression   . DDD (degenerative disc disease), cervical   . Anxiety   . Fibromyalgia     HOSPITAL COURSE:   # Acute right IJ , right subclavian vein -occlusive DVT Patient is started on heparin drip and the change to by mouth Elquis  without bolus as patient  had cardiac catheterization . Will discharge the patient with Eliquis and discontinued heparin. Patient  needs lifetime anticoagulation. Coagulation workup per oncology, discussed with Dr. Orlie Dakin. He is aware the patient has a family history of factor V Leiden deficiency with mutation Outpatient follow-up with oncology as recommended VQ scan is ordered which is low probability for pulmonary embolism   #Multilobar pneumonia-probably hospital-acquired Pleuritic chest pain resolved treated the patient with IV Zosyn and levofloxacin, clinically improved  will d/c  Patient with po levoflox Sputum culture if possible  # Multivessel coronary artery disease per stress test results- cardiac catheterization-no blockages noticed. 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 Continue metoprolol and add ARB to her regimen  #. Renal insufficiency, stable,  following closely with diuresis  #. Acute on chronic diastolic CHF with dilated cardiomyopathy improved since admission on diuretics, continue Lasix 40 mg twice daily, with potassium supplements  Outpatient follow-up with CHF clinic is recommended  repeat echocardiogram with 15% left ventricular ejection fraction  following in's and outs ARB is added to the regimen, but discontinued in view of hypotension  Metoprolol is changed to Coreg   outpatient follow-up with cardiology Dr. Gwen Pounds is recommended in a week   #. Elevated troponin, Myoview stress-12/28/15 has revealed multivessel coronary artery disease  #Persistent nausea-Zofran as needed and PPI   Management plans discussed with the patient, family and they are in agreement.  DISCHARGE CONDITIONS:   Fair  CONSULTS OBTAINED:  Treatment Team:  Lamar Blinks, MD Dalia Heading, MD Jeralyn Ruths, MD   PROCEDURES cardiac catheterization  DRUG ALLERGIES:   Allergies  Allergen Reactions  . Latex Dermatitis    DISCHARGE MEDICATIONS:   Current Discharge Medication List    START taking these medications   Details  acetaminophen (TYLENOL) 325 MG tablet Take 2 tablets (650 mg total) by mouth every 4 (four) hours as needed for headache or mild pain.    apixaban (ELIQUIS) 5 MG TABS tablet Take 2 tablets (10 mg total) by mouth 2 (two) times daily. Qty: 60 tablet, Refills: 0    aspirin EC 81 MG EC tablet Take 1 tablet (81 mg total) by mouth daily. Qty: 30 tablet, Refills: 0    diphenhydrAMINE (BENADRYL) 25 mg capsule Take 1 capsule (25 mg total) by mouth at bedtime as needed for sleep. Qty: 30 capsule, Refills: 0    furosemide (LASIX) 40 MG tablet Take 1 tablet (40 mg total) by mouth 2 (  two) times daily. Qty: 60 tablet, Refills: 0    levofloxacin (LEVAQUIN) 750 MG tablet Take 1 tablet (750 mg total) by mouth daily. Qty: 5 tablet, Refills: 0    potassium chloride SA (K-DUR,KLOR-CON) 20 MEQ tablet Take 2  tablets (40 mEq total) by mouth daily. Qty: 30 tablet, Refills: 0      CONTINUE these medications which have NOT CHANGED   Details  carvedilol (COREG) 3.125 MG tablet Take 3.125 mg by mouth 2 (two) times daily with a meal.    HYDROcodone-homatropine (HYCODAN) 5-1.5 MG/5ML syrup Take 5 mLs by mouth every 8 (eight) hours as needed for cough. Qty: 120 mL, Refills: 0   Associated Diagnoses: Cough    PROVENTIL HFA 108 (90 Base) MCG/ACT inhaler INHALE 2 PUFFS PO Q 6 H PRN FOR WHEEZING Refills: 12    venlafaxine XR (EFFEXOR-XR) 150 MG 24 hr capsule Take 150 mg by mouth daily with breakfast.    mometasone-formoterol (DULERA) 200-5 MCG/ACT AERO Inhale 2 puffs into the lungs 2 (two) times daily. Qty: 1 Inhaler, Refills: 0   Associated Diagnoses: Restrictive airway disease      STOP taking these medications     meloxicam (MOBIC) 15 MG tablet      methocarbamol (ROBAXIN) 750 MG tablet          DISCHARGE INSTRUCTIONS:   Heart Failure Clinic appointment on January 15, 2016 at 9:00am with Clarisa Kindred, FNP. Please call [phone] to reschedule.   Activity as tolerated Diet healthy heart Follow-up with primary care physician in a week Monitor daily weights Follow-up with cardiology in a week Follow-up with oncology in a week Continue anticoagulation   DIET:  Cardiac diet  DISCHARGE CONDITION:  Fair  ACTIVITY:  Activity as tolerated  OXYGEN:  Home Oxygen: No.   Oxygen Delivery: room air  DISCHARGE LOCATION:  home   If you experience worsening of your admission symptoms, develop shortness of breath, life threatening emergency, suicidal or homicidal thoughts you must seek medical attention immediately by calling 911 or calling your MD immediately  if symptoms less severe.  You Must read complete instructions/literature along with all the possible adverse reactions/side effects for all the Medicines you take and that have been prescribed to you. Take any new Medicines  after you have completely understood and accpet all the possible adverse reactions/side effects.   Please note  You were cared for by a hospitalist during your hospital stay. If you have any questions about your discharge medications or the care you received while you were in the hospital after you are discharged, you can call the unit and asked to speak with the hospitalist on call if the hospitalist that took care of you is not available. Once you are discharged, your primary care physician will handle any further medical issues. Please note that NO REFILLS for any discharge medications will be authorized once you are discharged, as it is imperative that you return to your primary care physician (or establish a relationship with a primary care physician if you do not have one) for your aftercare needs so that they can reassess your need for medications and monitor your lab values.     Today  Chief Complaint  Patient presents with  . Shortness of Breath   Patient is doing fine. Denies any chest pain, dizziness or shortness of breath. Cough is getting better.  ROS:  CONSTITUTIONAL: Denies fevers, chills. Denies any fatigue, weakness.  EYES: Denies blurry vision, double vision, eye pain. EARS, NOSE,  THROAT: Denies tinnitus, ear pain, hearing loss. RESPIRATORY: Denies cough, wheeze, shortness of breath.  CARDIOVASCULAR: Denies chest pain, palpitations, edema.  GASTROINTESTINAL: Denies nausea, vomiting, diarrhea, abdominal pain. Denies bright red blood per rectum. GENITOURINARY: Denies dysuria, hematuria. ENDOCRINE: Denies nocturia or thyroid problems. HEMATOLOGIC AND LYMPHATIC: Denies easy bruising or bleeding. SKIN: Denies rash or lesion. MUSCULOSKELETAL: Denies pain in neck, back, shoulder, knees, hips or arthritic symptoms.  NEUROLOGIC: Denies paralysis, paresthesias.  PSYCHIATRIC: Denies anxiety or depressive symptoms.   VITAL SIGNS:  Blood pressure 103/62, pulse 98, temperature  98.7 F (37.1 C), temperature source Oral, resp. rate 18, height  (1.753 m), weight 77.293 kg (170 lb 6.4 oz), SpO2 96 %.  I/O:   Intake/Output Summary (Last 24 hours) at 01/02/16 1227 Last data filed at 01/02/16 0830  Gross per 24 hour  Intake    290 ml  Output   2100 ml  Net  -1810 ml    PHYSICAL EXAMINATION:  GENERAL:  60 y.o.-year-old patient lying in the bed with no acute distress.  EYES: Pupils equal, round, reactive to light and accommodation. No scleral icterus. Extraocular muscles intact.  HEENT: Head atraumatic, normocephalic. Oropharynx and nasopharynx clear.  NECK:  Supple, no jugular venous distention. No thyroid enlargement, no tenderness.  LUNGS: Normal breath sounds bilaterally, no wheezing, rales,rhonchi or crepitation. No use of accessory muscles of respiration.  CARDIOVASCULAR: S1, S2 normal. No murmurs, rubs, or gallops.  ABDOMEN: Soft, non-tender, non-distended. Bowel sounds present. No organomegaly or mass.  EXTREMITIES: No pedal edema, cyanosis, or clubbing.  NEUROLOGIC: Cranial nerves II through XII are intact. Muscle strength 5/5 in all extremities. Sensation intact. Gait not checked.  PSYCHIATRIC: The patient is alert and oriented x 3.  SKIN: No obvious rash, lesion, or ulcer.   DATA REVIEW:   CBC  Recent Labs Lab 01/02/16 0744  WBC 8.4  HGB 12.9  HCT 39.8  PLT 399    Chemistries   Recent Labs Lab 12/30/15 0658 12/31/15 0509  NA 136 137  K 5.0 4.8  CL 98* 98*  CO2 32 33*  GLUCOSE 101* 104*  BUN 16 16  CREATININE 1.05* 1.11*  CALCIUM 8.6* 8.6*  MG 1.9  --     Cardiac Enzymes  Recent Labs Lab 12/28/15 0105  TROPONINI 0.12*    Microbiology Results  No results found for this or any previous visit.  RADIOLOGY:  Ct Abdomen Wo Contrast  12/31/2015  CLINICAL DATA:  Left chest and abdominal pain status post cardiac catheterization. CHF. EXAM: CT CHEST AND ABDOMEN WITHOUT CONTRAST TECHNIQUE: Multidetector CT imaging of the  chest and abdomen was performed following the standard protocol without IV contrast. COMPARISON:  12/20/2015 chest CT . FINDINGS: CT CHEST Mediastinum/Nodes: Stable mild cardiomegaly. Minimal pericardial fluid/ thickening, slightly increased. Great vessels are normal in course and caliber. Stable hypodense 1.1 cm anterior right thyroid lobe nodule and 1.0 cm coarsely calcified anterior left thyroid lobe nodule. Normal esophagus. No pathologically enlarged axillary, mediastinal or gross hilar lymph nodes, noting limited sensitivity for the detection of hilar adenopathy on this noncontrast study. There is a new central low-density filling defect within the right internal jugular vein, suspicious for acute deep venous thrombosis. Lungs/Pleura: No pneumothorax. Small layering right pleural effusion, decreased since 12/19/2015. Small layering left pleural effusion, increased since 12/20/2015. There are new wedge-shaped subpleural focus of patchy consolidation with surrounding ground-glass opacity in the anterior right lower lobe (series 4/ image 43) and basilar right lower lobe (series 4/ image 59). There  is new patchy consolidation and ground-glass opacity in the lingula and left lower lobe, with components of passive atelectasis in the lingula and left lower lobe. Mild interlobular septal thickening in the upper lobes likely represents mild pulmonary edema. Musculoskeletal: No aggressive appearing focal osseous lesions. Marked degenerative changes in the thoracic spine. CT ABDOMEN Hepatobiliary: There is a 1.0 cm simple liver cyst in the segment 4B left liver lobe. There are at least 3 additional scattered subcentimeter hypodense lesions throughout the liver, too small to characterize. There is a subcapsular focus of low-density in the anterior medial segment left liver lobe adjacent to the intersegmental fissure, favor focal fat. Normal gallbladder with no radiopaque cholelithiasis. No biliary ductal dilatation.  Pancreas: Normal, with no mass or duct dilation. Spleen: Normal size. No mass. Adrenals/Urinary Tract: Normal adrenals. There is symmetric excreted contrast in the bilateral renal collecting systems. No hydronephrosis. No renal mass. Stomach/Bowel: Tiny hiatal hernia. Otherwise collapsed and grossly normal stomach. Visualized small and large bowel is normal caliber, with no bowel wall thickening. Vascular/Lymphatic: Normal caliber abdominal aorta. No pathologically enlarged lymph nodes in the abdomen. Other: No pneumoperitoneum, ascites or focal fluid collection. No evidence of a retroperitoneal hematoma. Musculoskeletal: No aggressive appearing focal osseous lesions. Marked degenerative changes in the lumbar spine. IMPRESSION: 1. New central low-density filling defect in the right internal jugular vein, suspicious for acute deep venous thrombosis. Recommend correlation with right upper extremity venous Doppler scan. 2. New wedge shaped subpleural foci of patchy consolidation with surrounding ground-glass opacity in the right lower lobe. New patchy consolidation and ground-glass opacity in the lingula and left lower lobe. Findings are nonspecific and could represent multifocal pneumonia, however the possibility of pulmonary infarcts due to pulmonary embolism is not excluded. Correlate with chest CT angiography or V/Q scan as indicated. 3. Small left greater than right layering bilateral pleural effusions, decreased on the right and increased on the left, with associated passive lower lobe atelectasis. 4. Stable findings of mild congestive heart failure including mild cardiomegaly and mild pulmonary edema. 5. Minimal pericardial fluid/thickening, slightly increased. 6. No acute abnormality in the abdomen. No evidence of a retroperitoneal hematoma. Tiny hiatal hernia. These results will be called to the ordering clinician or representative by the Radiologist Assistant, and communication documented in the PACS or  zVision Dashboard. Electronically Signed   By: Delbert Phenix M.D.   On: 12/31/2015 09:17   Dg Chest 2 View  12/31/2015  CLINICAL DATA:  Pt having a VQ lung scan today. She is also having right sided chest pain today. Hx of fibromyalgia. Former smoker; quit 2014 EXAM: CHEST  2 VIEW COMPARISON:  Chest CT 12/31/2015, chest x-ray 12/27/2015 FINDINGS: Heart margins are obscured by bibasilar opacity. There are bilateral pleural effusions. Left lung base opacity consistent with atelectasis or infiltrate and appear stable. No evidence for pulmonary edema. Biapical pleural parenchymal thickening appears stable. IMPRESSION: Persistent significant opacity at the left lung base. Bilateral pleural effusions, left greater than right. Electronically Signed   By: Norva Pavlov M.D.   On: 12/31/2015 13:12   Ct Chest Without Contrast  12/31/2015  CLINICAL DATA:  Left chest and abdominal pain status post cardiac catheterization. CHF. EXAM: CT CHEST AND ABDOMEN WITHOUT CONTRAST TECHNIQUE: Multidetector CT imaging of the chest and abdomen was performed following the standard protocol without IV contrast. COMPARISON:  12/20/2015 chest CT . FINDINGS: CT CHEST Mediastinum/Nodes: Stable mild cardiomegaly. Minimal pericardial fluid/ thickening, slightly increased. Great vessels are normal in course and caliber. Stable hypodense  1.1 cm anterior right thyroid lobe nodule and 1.0 cm coarsely calcified anterior left thyroid lobe nodule. Normal esophagus. No pathologically enlarged axillary, mediastinal or gross hilar lymph nodes, noting limited sensitivity for the detection of hilar adenopathy on this noncontrast study. There is a new central low-density filling defect within the right internal jugular vein, suspicious for acute deep venous thrombosis. Lungs/Pleura: No pneumothorax. Small layering right pleural effusion, decreased since 12/19/2015. Small layering left pleural effusion, increased since 12/20/2015. There are new  wedge-shaped subpleural focus of patchy consolidation with surrounding ground-glass opacity in the anterior right lower lobe (series 4/ image 43) and basilar right lower lobe (series 4/ image 59). There is new patchy consolidation and ground-glass opacity in the lingula and left lower lobe, with components of passive atelectasis in the lingula and left lower lobe. Mild interlobular septal thickening in the upper lobes likely represents mild pulmonary edema. Musculoskeletal: No aggressive appearing focal osseous lesions. Marked degenerative changes in the thoracic spine. CT ABDOMEN Hepatobiliary: There is a 1.0 cm simple liver cyst in the segment 4B left liver lobe. There are at least 3 additional scattered subcentimeter hypodense lesions throughout the liver, too small to characterize. There is a subcapsular focus of low-density in the anterior medial segment left liver lobe adjacent to the intersegmental fissure, favor focal fat. Normal gallbladder with no radiopaque cholelithiasis. No biliary ductal dilatation. Pancreas: Normal, with no mass or duct dilation. Spleen: Normal size. No mass. Adrenals/Urinary Tract: Normal adrenals. There is symmetric excreted contrast in the bilateral renal collecting systems. No hydronephrosis. No renal mass. Stomach/Bowel: Tiny hiatal hernia. Otherwise collapsed and grossly normal stomach. Visualized small and large bowel is normal caliber, with no bowel wall thickening. Vascular/Lymphatic: Normal caliber abdominal aorta. No pathologically enlarged lymph nodes in the abdomen. Other: No pneumoperitoneum, ascites or focal fluid collection. No evidence of a retroperitoneal hematoma. Musculoskeletal: No aggressive appearing focal osseous lesions. Marked degenerative changes in the lumbar spine. IMPRESSION: 1. New central low-density filling defect in the right internal jugular vein, suspicious for acute deep venous thrombosis. Recommend correlation with right upper extremity venous  Doppler scan. 2. New wedge shaped subpleural foci of patchy consolidation with surrounding ground-glass opacity in the right lower lobe. New patchy consolidation and ground-glass opacity in the lingula and left lower lobe. Findings are nonspecific and could represent multifocal pneumonia, however the possibility of pulmonary infarcts due to pulmonary embolism is not excluded. Correlate with chest CT angiography or V/Q scan as indicated. 3. Small left greater than right layering bilateral pleural effusions, decreased on the right and increased on the left, with associated passive lower lobe atelectasis. 4. Stable findings of mild congestive heart failure including mild cardiomegaly and mild pulmonary edema. 5. Minimal pericardial fluid/thickening, slightly increased. 6. No acute abnormality in the abdomen. No evidence of a retroperitoneal hematoma. Tiny hiatal hernia. These results will be called to the ordering clinician or representative by the Radiologist Assistant, and communication documented in the PACS or zVision Dashboard. Electronically Signed   By: Delbert Phenix M.D.   On: 12/31/2015 09:17   Nm Pulmonary Perf And Vent  12/31/2015  CLINICAL DATA:  Chest pain at rest. EXAM: NUCLEAR MEDICINE VENTILATION - PERFUSION LUNG SCAN TECHNIQUE: Ventilation images were obtained in multiple projections using inhaled aerosol Tc-70m DTPA. Perfusion images were obtained in multiple projections after intravenous injection of Tc-83m MAA. RADIOPHARMACEUTICALS:  32.46 millicuries of Technetium-69m DTPA aerosol inhalation and 3.92 millicuries of Technetium-66m MAA IV COMPARISON:  Chest x-ray dated 12/31/2015 FINDINGS: Ventilation: There  is decreased perfusion at the left lung base due to the large effusion seen on chest x-ray. The ventilation exam is otherwise normal. Perfusion: There is slightly decreased profusion at the left lung base due to the effusion. There is a wedge-shaped perfusion defect at the right lung base which  is felt to be due to fluid along the major fissure due to the small right effusion. IMPRESSION: No findings suggestive of pulmonary embolism. Bilateral pleural effusions, left more than right. Electronically Signed   By: Francene Boyers M.D.   On: 12/31/2015 14:44   US Venous Img Upper Uni Right  12/31/2015  CLINICAL DATA:  DVT. EXAM: RIGHT UPPER EXTREMITY VENOUS DOPPLER ULTRASOUND TECHNIQUE: Gray-scale sonography with graded compression, as well as color Doppler and duplex ultrasound were performed to evaluate the upper extremity deep venous system from the level of the subclavian vein and including the jugular, axillary, basilic, radial, ulnar and upper cephalic vein. Spectral Doppler was utilized to evaluate flow at rest and with distal augmentation maneuvers. COMPARISON:  CT 12/31/2015 FINDINGS: Contralateral Subclavian Vein: Respiratory phasicity is normal and symmetric with the symptomatic side. No evidence of thrombus. Normal compressibility. Internal Jugular Vein: Occlusive noncompressible thrombus. Subclavian Vein: Occlusive noncompressible thrombus. Axillary Vein: No evidence of thrombus. Normal compressibility, respiratory phasicity and response to augmentation. Cephalic Vein: No evidence of thrombus. Normal compressibility, respiratory phasicity and response to augmentation. Basilic Vein: Near occlusive thrombus with decreased compressibility. Brachial Veins: No evidence of thrombus. Normal compressibility, respiratory phasicity and response to augmentation. Radial Veins: No evidence of thrombus. Normal compressibility, respiratory phasicity and response to augmentation. Ulnar Veins: No evidence of thrombus. Normal compressibility, respiratory phasicity and response to augmentation. Other Findings:  None visualized. IMPRESSION: Positive study for occlusive thrombosis of the right internal jugular vein and right subclavian vein . Near occlusive thrombus is noted in the basilic vein. These results will  be called to the ordering clinician or representative by the Radiologist Assistant, and communication documented in the PACS or zVision Dashboard. Electronically Signed   By: Maisie Fus  Register   On: 12/31/2015 12:05    EKG:   Orders placed or performed during the hospital encounter of 12/27/15  . ED EKG  . ED EKG  . EKG 12-Lead  . EKG 12-Lead  . ED EKG  . ED EKG  . EKG 12-Lead  . EKG 12-Lead      Management plans discussed with the patient, family and they are in agreement.  CODE STATUS:     Code Status Orders        Start     Ordered   12/31/15 0854  Full code   Continuous     12/31/15 0853    Code Status History    Date Active Date Inactive Code Status Order ID Comments User Context   12/27/2015 10:36 AM 12/31/2015  8:53 AM Full Code [id number]  Milagros Loll, MD ED      TOTAL TIME TAKING CARE OF THIS PATIENT: 45  minutes.    @[facility] @  on 01/02/2016 at 12:27 PM  Between 7am to 6pm - Pager - [phone]  After 6pm go to [email or web address] - password [email or web address] [facility]  [facility]  Office  [phone]  CC: Primary care physician; Margaretann Loveless, PA-C

## 2016-01-07 LAB — FACTOR 5 LEIDEN

## 2016-01-09 ENCOUNTER — Telehealth: Payer: Self-pay

## 2016-01-09 LAB — NM MYOCAR MULTI W/SPECT W/WALL MOTION / EF
CHL CUP NUCLEAR SDS: 0
CHL CUP STRESS STAGE 1 SPEED: 0 mph
CHL CUP STRESS STAGE 2 GRADE: 0 %
CHL CUP STRESS STAGE 2 SPEED: 0 mph
CHL CUP STRESS STAGE 3 HR: 90 {beats}/min
CHL CUP STRESS STAGE 3 SPEED: 0 mph
CHL CUP STRESS STAGE 4 DBP: 59 mmHg
CHL CUP STRESS STAGE 4 SBP: 111 mmHg
CHL CUP STRESS STAGE 4 SPEED: 0 mph
CHL CUP STRESS STAGE 5 GRADE: 0 %
CHL CUP STRESS STAGE 5 SPEED: 0 mph
CSEPPHR: 90 {beats}/min
Estimated workload: 1 METS
Exercise duration (min): 1 min
Exercise duration (sec): 8 s
LV dias vol: 316 mL
LVSYSVOL: 229 mL
Percent of predicted max HR: 55 %
Rest HR: 90 {beats}/min
SRS: 11
SSS: 5
Stage 1 Grade: 0 %
Stage 1 HR: 90 {beats}/min
Stage 2 HR: 90 {beats}/min
Stage 3 Grade: 0 %
Stage 4 Grade: 0 %
Stage 4 HR: 92 {beats}/min
Stage 5 DBP: 58 mmHg
Stage 5 HR: 89 {beats}/min
Stage 5 SBP: 109 mmHg
TID: 1

## 2016-01-09 NOTE — Telephone Encounter (Signed)
-----   Message from Margaretann Loveless, [place] sent at 01/09/2016  1:35 PM EST ----- Normal cardiac stress test per Dr. Dwaine Deter.

## 2016-01-09 NOTE — Telephone Encounter (Signed)
Attempted to contact patient. No answer and voicemail is not set up.  

## 2016-01-10 NOTE — Telephone Encounter (Signed)
No answer and voicemail not set up.  Thanks,  -Arleene Settle

## 2016-01-14 ENCOUNTER — Inpatient Hospital Stay: Payer: BLUE CROSS/BLUE SHIELD | Attending: Oncology | Admitting: Oncology

## 2016-01-14 ENCOUNTER — Inpatient Hospital Stay: Payer: BLUE CROSS/BLUE SHIELD

## 2016-01-14 VITALS — BP 133/91 | HR 80 | Temp 98.1°F | Resp 18 | Ht 69.69 in | Wt 168.9 lb

## 2016-01-14 DIAGNOSIS — Z79899 Other long term (current) drug therapy: Secondary | ICD-10-CM | POA: Insufficient documentation

## 2016-01-14 DIAGNOSIS — Z7901 Long term (current) use of anticoagulants: Secondary | ICD-10-CM | POA: Insufficient documentation

## 2016-01-14 DIAGNOSIS — Z86718 Personal history of other venous thrombosis and embolism: Secondary | ICD-10-CM | POA: Diagnosis not present

## 2016-01-14 DIAGNOSIS — I509 Heart failure, unspecified: Secondary | ICD-10-CM | POA: Diagnosis not present

## 2016-01-14 DIAGNOSIS — F419 Anxiety disorder, unspecified: Secondary | ICD-10-CM | POA: Insufficient documentation

## 2016-01-14 DIAGNOSIS — Z87891 Personal history of nicotine dependence: Secondary | ICD-10-CM

## 2016-01-14 DIAGNOSIS — Z809 Family history of malignant neoplasm, unspecified: Secondary | ICD-10-CM | POA: Diagnosis not present

## 2016-01-14 DIAGNOSIS — I2699 Other pulmonary embolism without acute cor pulmonale: Secondary | ICD-10-CM

## 2016-01-14 DIAGNOSIS — Z86711 Personal history of pulmonary embolism: Secondary | ICD-10-CM | POA: Diagnosis not present

## 2016-01-14 DIAGNOSIS — R918 Other nonspecific abnormal finding of lung field: Secondary | ICD-10-CM

## 2016-01-14 DIAGNOSIS — M5030 Other cervical disc degeneration, unspecified cervical region: Secondary | ICD-10-CM | POA: Insufficient documentation

## 2016-01-14 DIAGNOSIS — E041 Nontoxic single thyroid nodule: Secondary | ICD-10-CM | POA: Diagnosis not present

## 2016-01-14 DIAGNOSIS — F329 Major depressive disorder, single episode, unspecified: Secondary | ICD-10-CM | POA: Diagnosis not present

## 2016-01-14 DIAGNOSIS — Z801 Family history of malignant neoplasm of trachea, bronchus and lung: Secondary | ICD-10-CM | POA: Diagnosis not present

## 2016-01-14 DIAGNOSIS — Z7982 Long term (current) use of aspirin: Secondary | ICD-10-CM | POA: Diagnosis not present

## 2016-01-14 DIAGNOSIS — K7689 Other specified diseases of liver: Secondary | ICD-10-CM | POA: Insufficient documentation

## 2016-01-14 DIAGNOSIS — M797 Fibromyalgia: Secondary | ICD-10-CM | POA: Insufficient documentation

## 2016-01-14 DIAGNOSIS — K449 Diaphragmatic hernia without obstruction or gangrene: Secondary | ICD-10-CM | POA: Insufficient documentation

## 2016-01-14 DIAGNOSIS — J90 Pleural effusion, not elsewhere classified: Secondary | ICD-10-CM | POA: Diagnosis not present

## 2016-01-14 NOTE — Progress Notes (Signed)
Patient was diagnosed with DVT in ER and was started on Eliquis.  Also was diagnosed with CHF and started on treatment.  She has been having headaches everyday since starting the Lasix.

## 2016-01-15 ENCOUNTER — Ambulatory Visit: Payer: BLUE CROSS/BLUE SHIELD | Attending: Family | Admitting: Family

## 2016-01-15 ENCOUNTER — Telehealth: Payer: Self-pay | Admitting: Family

## 2016-01-15 ENCOUNTER — Encounter: Payer: Self-pay | Admitting: Family

## 2016-01-15 VITALS — BP 133/75 | HR 90 | Resp 20 | Ht 69.0 in | Wt 170.0 lb

## 2016-01-15 DIAGNOSIS — M797 Fibromyalgia: Secondary | ICD-10-CM | POA: Insufficient documentation

## 2016-01-15 DIAGNOSIS — Z7982 Long term (current) use of aspirin: Secondary | ICD-10-CM | POA: Insufficient documentation

## 2016-01-15 DIAGNOSIS — R634 Abnormal weight loss: Secondary | ICD-10-CM

## 2016-01-15 DIAGNOSIS — Z79899 Other long term (current) drug therapy: Secondary | ICD-10-CM | POA: Insufficient documentation

## 2016-01-15 DIAGNOSIS — Z888 Allergy status to other drugs, medicaments and biological substances status: Secondary | ICD-10-CM | POA: Insufficient documentation

## 2016-01-15 DIAGNOSIS — R000 Tachycardia, unspecified: Secondary | ICD-10-CM | POA: Diagnosis not present

## 2016-01-15 DIAGNOSIS — I5022 Chronic systolic (congestive) heart failure: Secondary | ICD-10-CM | POA: Insufficient documentation

## 2016-01-15 DIAGNOSIS — F419 Anxiety disorder, unspecified: Secondary | ICD-10-CM | POA: Diagnosis not present

## 2016-01-15 DIAGNOSIS — Z7901 Long term (current) use of anticoagulants: Secondary | ICD-10-CM | POA: Insufficient documentation

## 2016-01-15 DIAGNOSIS — Z87891 Personal history of nicotine dependence: Secondary | ICD-10-CM | POA: Diagnosis not present

## 2016-01-15 DIAGNOSIS — Z9889 Other specified postprocedural states: Secondary | ICD-10-CM | POA: Diagnosis not present

## 2016-01-15 LAB — BASIC METABOLIC PANEL
Anion gap: 7 (ref 5–15)
BUN: 17 mg/dL (ref 6–20)
CHLORIDE: 105 mmol/L (ref 101–111)
CO2: 28 mmol/L (ref 22–32)
Calcium: 9 mg/dL (ref 8.9–10.3)
Creatinine, Ser: 0.79 mg/dL (ref 0.44–1.00)
GFR calc Af Amer: >60 mL/min (ref >60)
GFR calc non Af Amer: >60 mL/min (ref >60)
GLUCOSE: 96 mg/dL (ref 65–99)
POTASSIUM: 4 mmol/L (ref 3.5–5.1)
Sodium: 140 mmol/L (ref 135–145)

## 2016-01-15 LAB — ANTITHROMBIN III: ANTITHROMB III FUNC: 99 % (ref 75–120)

## 2016-01-15 MED ORDER — SACUBITRIL-VALSARTAN 24-26 MG PO TABS: 1.0000 | ORAL_TABLET | Freq: Two times a day (BID) | ORAL | Status: DC

## 2016-01-15 NOTE — Telephone Encounter (Signed)
Attempted to reach patient but unable to leave a message at the home number nor at either of the other numbers listed. Will try again tomorrow to let patient know that her lab work is completely normal.

## 2016-01-15 NOTE — Progress Notes (Signed)
Subjective:    Patient ID: Maureen Grant, female    DOB: [birth year], 60 y.o.   MRN: [id number]  Congestive Heart Failure Presents for initial visit. The disease course has been improving. Associated symptoms include fatigue, palpitations and shortness of breath. Pertinent negatives include no abdominal pain, chest pain, edema or orthopnea. The symptoms have been improving. Past treatments include beta blockers and salt and fluid restriction. The treatment provided moderate relief. Compliance with prior treatments has been good. There is no history of chronic lung disease, CVA, DM or HTN. She has multiple 1st degree relatives with heart disease. Compliance with total regimen is 76-100%.  Anxiety Presents for initial visit. Onset was 1 to 5 years ago. The problem has been waxing and waning. Symptoms include excessive worry, malaise, nervous/anxious behavior, palpitations and shortness of breath. Patient reports no chest pain, confusion, dizziness or nausea. Symptoms occur most days. The severity of symptoms is moderate. Exacerbated by: current health.   Risk factors include change in medication and a major life event. Her past medical history is significant for depression and fibromyalgia. There is no history of chronic lung disease. Past treatments include non-SSRI antidepressants. The treatment provided moderate relief. Compliance with prior treatments has been good.    Past Medical History  Diagnosis Date  . Depression   . DDD (degenerative disc disease), cervical   . Anxiety   . Fibromyalgia   . CHF (congestive heart failure) [facility])     Past Surgical History  Procedure Laterality Date  . Hx of rotator cuff surgery Right 10/07/2001  . Cardiac catheterization N/A 12/31/2015    Procedure: Right/Left Heart Cath and Coronary Angiography;  Surgeon: Lamar Blinks, MD;  Location: [facility] INVASIVE CV LAB;  Service: Cardiovascular;  Laterality: N/A;    Family History  Problem Relation Age of  Onset  . Arthritis Mother   . Heart disease Mother   . Clotting disorder Mother   . Depression Sister   . Cancer Maternal Grandmother   . Cancer Maternal Grandfather     Lung  . Alcohol abuse Maternal Grandfather   . Diabetes Paternal Grandmother   . Dementia Paternal Grandmother   . Cancer Sister   . Thyroid disease Sister   . Heart disease Father   . CVA Father   . Alcohol abuse Father     Social History  Substance Use Topics  . Smoking status: Former Smoker -- 0.50 packs/day for 45 years    Types: E-cigarettes, Cigarettes    Quit date: 12/02/2015  . Smokeless tobacco: Never Used  . Alcohol Use: No    Allergies  Allergen Reactions  . Latex Dermatitis   Prior to Admission medications   Medication Sig Start Date End Date Taking? Authorizing Provider  acetaminophen (TYLENOL) 325 MG tablet Take 2 tablets (650 mg total) by mouth every 4 (four) hours as needed for headache or mild pain. 01/02/16  Yes Ramonita Lab, MD  apixaban (ELIQUIS) 5 MG TABS tablet Take 5 mg by mouth 2 (two) times daily.   Yes Historical Provider, MD  aspirin EC 81 MG EC tablet Take 1 tablet (81 mg total) by mouth daily. 01/02/16  Yes Ramonita Lab, MD  carvedilol (COREG) 3.125 MG tablet Take 1 tablet (3.125 mg total) by mouth 2 (two) times daily with a meal. 01/02/16  Yes Ramonita Lab, MD  diphenhydrAMINE (BENADRYL) 25 mg capsule Take 1 capsule (25 mg total) by mouth at bedtime as needed for sleep. 01/02/16  Yes Ramonita Lab, MD  furosemide (LASIX) 40 MG tablet Take 1 tablet (40 mg total) by mouth 2 (two) times daily. Patient taking differently: Take 20 mg by mouth daily.  01/02/16  Yes Ramonita Lab, MD  venlafaxine XR (EFFEXOR-XR) 150 MG 24 hr capsule Take 150 mg by mouth daily with breakfast.   Yes Historical Provider, MD  PROVENTIL HFA 108 (90 Base) MCG/ACT inhaler Reported on 01/15/2016 12/04/15   Historical Provider, MD  sacubitril-valsartan (ENTRESTO) 24-26 MG Take 1 tablet by mouth 2 (two) times daily. 01/15/16   Delma Freeze, FNP                                                                                      Review of Systems  Constitutional: Positive for appetite change (decreased due to lasix) and fatigue.  HENT: Positive for rhinorrhea. Negative for congestion and sore throat.   Eyes: Negative.   Respiratory: Positive for cough, chest tightness (when talking a lot) and shortness of breath. Negative for wheezing.   Cardiovascular: Positive for palpitations. Negative for chest pain and leg swelling.  Gastrointestinal: Negative for nausea, abdominal pain and abdominal distention.  Endocrine: Negative.   Genitourinary: Negative.   Musculoskeletal: Positive for arthralgias (left shoulder). Negative for back pain.  Skin: Negative.   Allergic/Immunologic: Negative.   Neurological: Positive for headaches (with lasix). Negative for dizziness and light-headedness.  Hematological: Negative for adenopathy. Bruises/bleeds easily.  Psychiatric/Behavioral: Positive for sleep disturbance (sleeping on 1 pillow) and dysphoric mood. Negative for confusion. The patient is nervous/anxious.        Objective:   Physical Exam  Constitutional: She is oriented to person, place, and time. She appears well-developed and well-nourished.  HENT:  Head: Normocephalic and atraumatic.  Eyes: Conjunctivae are normal. Pupils are equal, round, and reactive to light.  Neck: Normal range of motion. Neck supple.  Cardiovascular: Regular rhythm.  Tachycardia present.   No murmur heard. Pulmonary/Chest: Effort normal. She has no wheezes. She has no rales.  Abdominal: Soft. She exhibits no distension. There is no tenderness.  Musculoskeletal: She exhibits no edema or tenderness.  Neurological: She is alert and oriented to person, place, and time.  Skin: Skin is warm and dry.  Psychiatric: She has a normal mood and affect. Her behavior is normal. Thought content normal.  Nursing note and vitals reviewed.   BP  133/75 mmHg  Pulse 90  Resp 20  Ht 5\' 9"  (1.753 m)  Wt 170 lb (77.111 kg)  BMI 25.09 kg/m2  SpO2 100%       Assessment & Plan:  1: Chronic heart failure with reduced ejection fraction- Patient presents with fatigue and shortness of breath with exertion although she feels like both symptoms are improving. She also notices that she gets short of breath when she talks a lot and then she'll start coughing and then develop chest tightness. Once the coughing stops, the chest tightness improves. She is already weighing daily. Reminded to call for an overnight weight gain of >2 pounds or a weekly weight gain of >5 pounds. She is not adding any salt to her food and the importance of following a 2000mg  sodium diet was discussed and  written information was given to her about the diet. Currently without any swelling in her lower legs. She says that she thinks the lasix gives her a headache as well as a decreased appetite. Is currently taking  daily so we discussed that she could try taking it on a PRN basis based on her weight and symptoms. She is agreeable to that. Could also consider changing her to torsemide. She was taking OTC potassium supplements because she could not swallow the prescription tablets and she was instructed to stop those for now and will check a basic metabolic panel today. Will start Entresto 24/26mg  twice daily and will check labs in a month. Also discussed increasing carvedilol at future visits. [facility] went in and reviewed medications with the patient. Discussed cardiac rehab with the patient and she is interested but she must be stable for 6 weeks. Will address this with at her next visit. 2: Tachycardia- Patient is talking quite a bit and admits to being anxious. Consider increasing carvedilol in the future. 3: Anxiety- She has been taking effexor for her depression, anxiety and fibromyalgia but is currently out. Has an appointment with her PCP tomorrow to get a new  prescription. She admits that she's very anxious about the current diagnosis of HF. She said that she felt better after all the education. 4: Weight loss- She says that when she takes the furosemide, her appetite decreases and she loses weight. Adjusting furosemide per above.  Return here in 1 month or sooner for any questions/problems before then.

## 2016-01-15 NOTE — Patient Instructions (Addendum)
Continue weighing daily and call for an overnight weight gain of > 2 pounds or a weekly weight gain of >5 pounds.  Hold potassium tablets for now. Take furosemide (lasix) as needed based on weight gain parameters, edema or shortness of breath.  Beginning Entresto 24/26mg  twice daily. Checking lab work today and will check again in 1 month.

## 2016-01-16 ENCOUNTER — Telehealth: Payer: Self-pay | Admitting: Family

## 2016-01-16 ENCOUNTER — Encounter: Payer: Self-pay | Admitting: Physician Assistant

## 2016-01-16 ENCOUNTER — Ambulatory Visit (INDEPENDENT_AMBULATORY_CARE_PROVIDER_SITE_OTHER): Payer: BLUE CROSS/BLUE SHIELD | Admitting: Physician Assistant

## 2016-01-16 DIAGNOSIS — F419 Anxiety disorder, unspecified: Secondary | ICD-10-CM

## 2016-01-16 DIAGNOSIS — F329 Major depressive disorder, single episode, unspecified: Secondary | ICD-10-CM | POA: Diagnosis not present

## 2016-01-16 DIAGNOSIS — F32A Depression, unspecified: Secondary | ICD-10-CM

## 2016-01-16 DIAGNOSIS — I82629 Acute embolism and thrombosis of deep veins of unspecified upper extremity: Secondary | ICD-10-CM | POA: Insufficient documentation

## 2016-01-16 MED ORDER — VENLAFAXINE HCL ER 150 MG PO CP24: 150.0000 mg | ORAL_CAPSULE | Freq: Every day | ORAL | Status: DC

## 2016-01-16 NOTE — Telephone Encounter (Signed)
Spoke with patient to let her know that her potassium level and renal function are all normal. Patient was thankful for the phone call and said she was relieved to hear that everything looked good.

## 2016-01-16 NOTE — Patient Instructions (Signed)
Venlafaxine extended-release capsules  What is this medicine?  VENLAFAXINE(VEN la fax een) is used to treat depression, anxiety and panic disorder.  This medicine may be used for other purposes; ask your health care provider or pharmacist if you have questions.  What should I tell my health care provider before I take this medicine?  They need to know if you have any of these conditions:  -bleeding disorders  -glaucoma  -heart disease  -high blood pressure  -high cholesterol  -kidney disease  -liver disease  -low levels of sodium in the blood  -mania or bipolar disorder  -seizures  -suicidal thoughts, plans, or attempt; a previous suicide attempt by you or a family  -take medicines that treat or prevent blood clots  -thyroid disease  -an unusual or allergic reaction to venlafaxine, desvenlafaxine, other medicines, foods, dyes, or preservatives  -pregnant or trying to get pregnant  -breast-feeding  How should I use this medicine?  Take this medicine by mouth with a full glass of water. Follow the directions on the prescription label. Do not cut, crush, or chew this medicine. Take it with food. If needed, the capsule may be carefully opened and the entire contents sprinkled on a spoonful of cool applesauce. Swallow the applesauce/pellet mixture right away without chewing and follow with a glass of water to ensure complete swallowing of the pellets. Try to take your medicine at about the same time each day. Do not take your medicine more often than directed. Do not stop taking this medicine suddenly except upon the advice of your doctor. Stopping this medicine too quickly may cause serious side effects or your condition may worsen.  A special MedGuide will be given to you by the pharmacist with each prescription and refill. Be sure to read this information carefully each time.  Talk to your pediatrician regarding the use of this medicine in children. Special care may be needed.  Overdosage: If you think you have taken  too much of this medicine contact a poison control center or emergency room at once.  NOTE: This medicine is only for you. Do not share this medicine with others.  What if I miss a dose?  If you miss a dose, take it as soon as you can. If it is almost time for your next dose, take only that dose. Do not take double or extra doses.  What may interact with this medicine?  Do not take this medicine with any of the following medications:  -certain medicines for fungal infections like fluconazole, itraconazole, ketoconazole, posaconazole, voriconazole  -cisapride  -desvenlafaxine  -dofetilide  -dronedarone  -duloxetine  -levomilnacipran  -linezolid  -MAOIs like Carbex, Eldepryl, Marplan, Nardil, and Parnate  -methylene blue (injected into a vein)  -milnacipran  -pimozide  -thioridazine  -ziprasidone  This medicine may also interact with the following medications:  -aspirin and aspirin-like medicines  -certain medicines for depression, anxiety, or psychotic disturbances  -certain medicines for migraine headaches like almotriptan, eletriptan, frovatriptan, naratriptan, rizatriptan, sumatriptan, zolmitriptan  -certain medicines for sleep  -certain medicines that treat or prevent blood clots like dalteparin, enoxaparin, warfarin  -cimetidine  -clozapine  -diuretics  -fentanyl  -furazolidone  -indinavir  -isoniazid  -lithium  -metoprolol  -NSAIDS, medicines for pain and inflammation, like ibuprofen or naproxen  -other medicines that prolong the QT interval (cause an abnormal heart rhythm)  -procarbazine  -rasagiline  -supplements like St. John's wort, kava kava, valerian  -tramadol  -tryptophan  This list may not describe all possible interactions.   Give your health care provider a list of all the medicines, herbs, non-prescription drugs, or dietary supplements you use. Also tell them if you smoke, drink alcohol, or use illegal drugs. Some items may interact with your medicine.  What should I watch for while using this  medicine?  Tell your doctor if your symptoms do not get better or if they get worse. Visit your doctor or health care professional for regular checks on your progress. Because it may take several weeks to see the full effects of this medicine, it is important to continue your treatment as prescribed by your doctor.  Patients and their families should watch out for new or worsening thoughts of suicide or depression. Also watch out for sudden changes in feelings such as feeling anxious, agitated, panicky, irritable, hostile, aggressive, impulsive, severely restless, overly excited and hyperactive, or not being able to sleep. If this happens, especially at the beginning of treatment or after a change in dose, call your health care professional.  This medicine can cause an increase in blood pressure. Check with your doctor for instructions on monitoring your blood pressure while taking this medicine.  You may get drowsy or dizzy. Do not drive, use machinery, or do anything that needs mental alertness until you know how this medicine affects you. Do not stand or sit up quickly, especially if you are an older patient. This reduces the risk of dizzy or fainting spells. Alcohol may interfere with the effect of this medicine. Avoid alcoholic drinks.  Your mouth may get dry. Chewing sugarless gum, sucking hard candy and drinking plenty of water will help. Contact your doctor if the problem does not go away or is severe.  What side effects may I notice from receiving this medicine?  Side effects that you should report to your doctor or health care professional as soon as possible:  -allergic reactions like skin rash, itching or hives, swelling of the face, lips, or tongue  -breathing problems  -changes in vision  -hallucination, loss of contact with reality  -seizures  -suicidal thoughts or other mood changes  -trouble passing urine or change in the amount of urine  -unusual bleeding or bruising  Side effects that usually do  not require medical attention (report to your doctor or health care professional if they continue or are bothersome):  -change in sex drive or performance  -constipation  -increased sweating  -loss of appetite  -nausea  -tremors  -weight loss  This list may not describe all possible side effects. Call your doctor for medical advice about side effects. You may report side effects to FDA at 1-800-FDA-1088.  Where should I keep my medicine?  Keep out of the reach of children.  Store at a controlled temperature between 20 and 25 degrees C (68 degrees and 77 degrees F), in a dry place. Throw away any unused medicine after the expiration date.  NOTE: This sheet is a summary. It may not cover all possible information. If you have questions about this medicine, talk to your doctor, pharmacist, or health care provider.     © 2016, [employer]. (2013-06-14 12:46:03)

## 2016-01-16 NOTE — Progress Notes (Signed)
Patient: Maureen Grant Female    DOB: 08-21-1956   60 y.o.   MRN: [id number] Visit Date: 01/16/2016  Today's Provider: Margaretann Loveless, PA-C   Chief Complaint  Patient presents with  . Medication Refill   Subjective:    HPI  Maureen Grant is here today for medication refill on her Effexor.She has been taking effexor for her depression, anxiety and fibromyalgia but is currently out.   She was hospitalized 12/27/2015 secondary to worsening shortness of breath and dyspnea on exertion. She was found to have congestive heart failure with an ejection fraction of 20%. 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 She was seen by vascular surgery and started on Eliquis 5 mg twice daily. She is tolerating this well. She has since followed up with Dr. Orlie Dakin in hematology for evaluation and testing for coagulation disorder. Her mother did have factor V Leiden deficiency and ended up passing away from blood clots. Her mother passed away 2016-01-13.   She has since been seen by cardiology and underwent a stress test and cardiac catheterization. All were negative. She was placed on furosemide 40 mg initially. She did have side effects of headaches with this medication. She has achieved her dry weight and is not having any symptoms. She denies having any chest pain, leg swelling, SOB,chest tightness. She says she feels good. She is currently not taking the furosemide at this time due to being asymptomatic. She is continuing to check her weights daily and knows that if she has a weight gain of greater than 2 pounds that she is to restart her furosemide and call Clarisa Kindred, NP. She also is taking Entresto for her congestive heart failure and is tolerating this medication well.  Currently she is not worrying about herself. She is worried about her sister who is at the doctor today. She was recently diagnosed with breast cancer.  She is feeling  more tired. She is having pain on her arms from the fibromyalgia secondary to not having her effexor over the last couple of days. She states when she does have it the Effexor  is controlling her symptoms.     Allergies  Allergen Reactions  . Latex Dermatitis   Previous Medications   ACETAMINOPHEN (TYLENOL) 325 MG TABLET    Take 2 tablets (650 mg total) by mouth every 4 (four) hours as needed for headache or mild pain.   APIXABAN (ELIQUIS) 5 MG TABS TABLET    Take 5 mg by mouth 2 (two) times daily.   ASPIRIN EC 81 MG EC TABLET    Take 1 tablet (81 mg total) by mouth daily.   CARVEDILOL (COREG) 3.125 MG TABLET    Take 1 tablet (3.125 mg total) by mouth 2 (two) times daily with a meal.   DIPHENHYDRAMINE (BENADRYL) 25 MG CAPSULE    Take 1 capsule (25 mg total) by mouth at bedtime as needed for sleep.   FUROSEMIDE (LASIX) 40 MG TABLET    Take 1 tablet (40 mg total) by mouth 2 (two) times daily.   PROVENTIL HFA 108 (90 BASE) MCG/ACT INHALER    Reported on 01/15/2016   SACUBITRIL-VALSARTAN (ENTRESTO) 24-26 MG    Take 1 tablet by mouth 2 (two) times daily.   VENLAFAXINE XR (EFFEXOR-XR) 150 MG 24 HR CAPSULE    Take 150 mg by mouth daily with breakfast.    Review of Systems  Constitutional:  Positive for fatigue.  HENT: Negative.   Respiratory: Negative for cough, chest tightness, shortness of breath and wheezing.   Cardiovascular: Negative for chest pain, palpitations and leg swelling.  Gastrointestinal: Negative for nausea, vomiting and abdominal pain.  Musculoskeletal: Positive for myalgias.  Neurological: Negative for dizziness and headaches.  Hematological: Does not bruise/bleed easily.    Social History  Substance Use Topics  . Smoking status: Former Smoker -- 0.50 packs/day for 45 years    Types: E-cigarettes, Cigarettes    Quit date: 12/02/2015  . Smokeless tobacco: Never Used  . Alcohol Use: No   Objective:   BP 128/70 mmHg  Pulse 83  Temp(Src) 98.9 F (37.2 C) (Oral)   Resp 16  Wt 171 lb (77.565 kg)  Physical Exam  Constitutional: She appears well-developed and well-nourished. No distress.  Cardiovascular: Normal rate, regular rhythm and normal heart sounds.  Exam reveals no gallop and no friction rub.   No murmur heard. Pulmonary/Chest: Effort normal and breath sounds normal. No respiratory distress. She has no wheezes. She has no rales.  Skin: She is not diaphoretic.  Psychiatric: She has a normal mood and affect. Her behavior is normal. Judgment and thought content normal.  Vitals reviewed.       Assessment & Plan:     1. Anxiety Improving secondary to becoming educated about her medical issues.  2. Clinical depression Stable with Effexor 150 mg. Will refill medication as below. Continue current medical treatment plan. I will see her back in 6 months for her annual physical exam. She is to call the office if she has any acute issues, questions or concerns in the meantime. - venlafaxine XR (EFFEXOR-XR) 150 MG 24 hr capsule; Take 1 capsule (150 mg total) by mouth daily with breakfast.  Dispense: 30 capsule; Refill: 11       Margaretann Loveless, PA-C  [facility]

## 2016-01-17 NOTE — Telephone Encounter (Signed)
Patient is followed by cardiologist and had other work ups done.  Thanks,  -Sharine Cadle

## 2016-01-20 ENCOUNTER — Encounter: Payer: Self-pay | Admitting: Family

## 2016-01-21 ENCOUNTER — Encounter: Payer: Self-pay | Admitting: Family

## 2016-01-21 ENCOUNTER — Other Ambulatory Visit: Payer: Self-pay | Admitting: Family

## 2016-01-21 LAB — BETA-2-GLYCOPROTEIN I ABS, IGG/M/A
Beta-2 Glyco I IgG: <9 GPI IgG units (ref 0–20)
Beta-2-Glycoprotein I IgM: <9 GPI IgM units (ref 0–32)

## 2016-01-21 LAB — PROTHROMBIN GENE MUTATION

## 2016-01-21 LAB — PROTEIN S, TOTAL: PROTEIN S AG TOTAL: 141 % (ref 60–150)

## 2016-01-21 LAB — PROTEIN S ACTIVITY: PROTEIN S ACTIVITY: 172 % — AB (ref 63–140)

## 2016-01-21 LAB — DRVVT CONFIRM: DRVVT CONFIRM: 0.8 ratio (ref 0.8–1.2)

## 2016-01-21 LAB — CARDIOLIPIN ANTIBODIES, IGG, IGM, IGA
Anticardiolipin IgA: <9 APL U/mL (ref 0–11)
Anticardiolipin IgG: <9 GPL U/mL (ref 0–14)

## 2016-01-21 LAB — LUPUS ANTICOAGULANT PANEL
DRVVT: 70.8 s — ABNORMAL HIGH (ref 0.0–44.0)
PTT Lupus Anticoagulant: 39 s (ref 0.0–40.6)

## 2016-01-21 LAB — PROTEIN C ACTIVITY: PROTEIN C ACTIVITY: 85 % (ref 73–180)

## 2016-01-21 LAB — DRVVT MIX: dRVVT Mix: 54.1 s — ABNORMAL HIGH (ref 0.0–44.0)

## 2016-01-21 LAB — PROTEIN C, TOTAL: Protein C, Total: 81 % (ref 60–150)

## 2016-01-21 LAB — HOMOCYSTEINE: HOMOCYSTEINE-NORM: 16.3 umol/L — AB (ref 0.0–15.0)

## 2016-01-21 MED ORDER — TORSEMIDE 20 MG PO TABS: 10.0000 mg | ORAL_TABLET | Freq: Every day | ORAL | Status: DC | PRN

## 2016-01-24 NOTE — Progress Notes (Signed)
[facility]  [phone] [phone]  ID: KAESHA KIRSCH OB: [birth year]  MR#: [id number]  AOZ#:[id number]  Patient Care Team: Margaretann Loveless, PA-C as PCP - General (Family Medicine) Delma Freeze, FNP as Nurse Practitioner (Family Medicine) Dalia Heading, MD as Consulting Physician (Cardiology) Jeralyn Ruths, MD as Consulting Physician (Oncology)  CHIEF COMPLAINT:  Chief Complaint  Patient presents with  . New Evaluation    anticoagulation management    INTERVAL HISTORY: Patient was initially evaluated as an inpatient after presenting to the hospital with worsening shortness of breath.  Subsequent workup included CT scan which revealed right internal jugular vein DVT as well as pulmonary infarcts likely due to pulmonary embolus. Patient reports her mother recently died of blood clot and was found to have factor V Leiden. Currently she feels well and is back to her baseline. She has no neurologic complaints. She denies any recent fevers. She denies any further shortness of breath or chest pain. She has no nausea, vomiting, consultation, or diarrhea. She has no easy bleeding or bruising. She has no urinary complaints. Patient offers no specific complaints today.   REVIEW OF SYSTEMS:   Review of Systems  Constitutional: Negative for fever, weight loss and malaise/fatigue.  Respiratory: Negative.  Negative for cough.   Cardiovascular: Negative.  Negative for chest pain.  Gastrointestinal: Negative.  Negative for abdominal pain, blood in stool and melena.  Musculoskeletal: Negative.   Neurological: Negative.  Negative for weakness.  Endo/Heme/Allergies: Does not bruise/bleed easily.    As per HPI. Otherwise, a complete review of systems is negatve.  PAST MEDICAL HISTORY: Past Medical History  Diagnosis Date  . Depression   . DDD (degenerative disc disease), cervical   . Anxiety   . Fibromyalgia   . CHF (congestive heart failure)  (HCC)     PAST SURGICAL HISTORY: Past Surgical History  Procedure Laterality Date  . Hx of rotator cuff surgery Right 10/07/2001  . Cardiac catheterization N/A 12/31/2015    Procedure: Right/Left Heart Cath and Coronary Angiography;  Surgeon: Lamar Blinks, MD;  Location: [facility] INVASIVE CV LAB;  Service: Cardiovascular;  Laterality: N/A;    FAMILY HISTORY Family History  Problem Relation Age of Onset  . Arthritis Mother   . Heart disease Mother   . Clotting disorder Mother   . Depression Sister   . Cancer Maternal Grandmother   . Cancer Maternal Grandfather     Lung  . Alcohol abuse Maternal Grandfather   . Diabetes Paternal Grandmother   . Dementia Paternal Grandmother   . Cancer Sister   . Thyroid disease Sister   . Heart disease Father   . CVA Father   . Alcohol abuse Father        ADVANCED DIRECTIVES:    HEALTH MAINTENANCE: Social History  Substance Use Topics  . Smoking status: Former Smoker -- 0.50 packs/day for 45 years    Types: E-cigarettes, Cigarettes    Quit date: 12/02/2015  . Smokeless tobacco: Never Used  . Alcohol Use: No     Colonoscopy:  PAP:  Bone density:  Lipid panel:  Allergies  Allergen Reactions  . Latex Dermatitis    Current Outpatient Prescriptions  Medication Sig Dispense Refill  . acetaminophen (TYLENOL) 325 MG tablet Take 2 tablets (650 mg total) by mouth every 4 (four) hours as needed for headache or mild pain.    Marland Kitchen aspirin EC 81 MG EC tablet Take 1 tablet (81 mg total) by mouth  daily. 30 tablet 0  . carvedilol (COREG) 3.125 MG tablet Take 1 tablet (3.125 mg total) by mouth 2 (two) times daily with a meal. 60 tablet 0  . diphenhydrAMINE (BENADRYL) 25 mg capsule Take 1 capsule (25 mg total) by mouth at bedtime as needed for sleep. 30 capsule 0  . PROVENTIL HFA 108 (90 Base) MCG/ACT inhaler Reported on 01/15/2016  12  . apixaban (ELIQUIS) 5 MG TABS tablet Take 5 mg by mouth 2 (two) times daily.    . sacubitril-valsartan  (ENTRESTO) 24-26 MG Take 1 tablet by mouth 2 (two) times daily. 60 tablet 3  . torsemide (DEMADEX) 20 MG tablet Take 0.5 tablets (10 mg total) by mouth daily as needed. 30 tablet 3  . venlafaxine XR (EFFEXOR-XR) 150 MG 24 hr capsule Take 1 capsule (150 mg total) by mouth daily with breakfast. 30 capsule 11   No current facility-administered medications for this visit.    OBJECTIVE: Filed Vitals:   01/14/16 1528  BP: 133/91  Pulse: 80  Temp: 98.1 F (36.7 C)  Resp: 18     Body mass index is 24.45 kg/(m^2).    ECOG FS:0 - Asymptomatic  General: Well-developed, well-nourished, no acute distress. Eyes: Pink conjunctiva, anicteric sclera. Lungs: Clear to auscultation bilaterally. Heart: Regular rate and rhythm. No rubs, murmurs, or gallops. Abdomen: Soft, nontender, nondistended. No organomegaly noted, normoactive bowel sounds. Musculoskeletal: No edema, cyanosis, or clubbing. Neuro: Alert, answering all questions appropriately. Cranial nerves grossly intact. Skin: No rashes or petechiae noted. Psych: Normal affect.   LAB RESULTS:  Lab Results  Component Value Date   NA 140 01/15/2016   K 4.0 01/15/2016   CL 105 01/15/2016   CO2 28 01/15/2016   GLUCOSE 96 01/15/2016   BUN 17 01/15/2016   CREATININE 0.79 01/15/2016   CALCIUM 9.0 01/15/2016   PROT 7.0 05/02/2014   ALBUMIN 3.5 05/02/2014   AST 16 05/02/2014   ALT 18 05/02/2014   ALKPHOS 94 05/02/2014   BILITOT 0.4 05/02/2014   GFRNONAA >60 01/15/2016   GFRAA >60 01/15/2016    Lab Results  Component Value Date   WBC 8.4 01/02/2016   NEUTROABS 10.6* 12/27/2015   HGB 12.9 01/02/2016   HCT 39.8 01/02/2016   MCV 85.4 01/02/2016   PLT 399 01/02/2016     STUDIES: Ct Abdomen Wo Contrast  01/11/16  CLINICAL DATA:  Left chest and abdominal pain status post cardiac catheterization. CHF. EXAM: CT CHEST AND ABDOMEN WITHOUT CONTRAST TECHNIQUE: Multidetector CT imaging of the chest and abdomen was performed following the  standard protocol without IV contrast. COMPARISON:  12/20/2015 chest CT . FINDINGS: CT CHEST Mediastinum/Nodes: Stable mild cardiomegaly. Minimal pericardial fluid/ thickening, slightly increased. Great vessels are normal in course and caliber. Stable hypodense 1.1 cm anterior right thyroid lobe nodule and 1.0 cm coarsely calcified anterior left thyroid lobe nodule. Normal esophagus. No pathologically enlarged axillary, mediastinal or gross hilar lymph nodes, noting limited sensitivity for the detection of hilar adenopathy on this noncontrast study. There is a new central low-density filling defect within the right internal jugular vein, suspicious for acute deep venous thrombosis. Lungs/Pleura: No pneumothorax. Small layering right pleural effusion, decreased since 12/19/2015. Small layering left pleural effusion, increased since 12/20/2015. There are new wedge-shaped subpleural focus of patchy consolidation with surrounding ground-glass opacity in the anterior right lower lobe (series 4/ image 43) and basilar right lower lobe (series 4/ image 59). There is new patchy consolidation and ground-glass opacity in the lingula and left lower  lobe, with components of passive atelectasis in the lingula and left lower lobe. Mild interlobular septal thickening in the upper lobes likely represents mild pulmonary edema. Musculoskeletal: No aggressive appearing focal osseous lesions. Marked degenerative changes in the thoracic spine. CT ABDOMEN Hepatobiliary: There is a 1.0 cm simple liver cyst in the segment 4B left liver lobe. There are at least 3 additional scattered subcentimeter hypodense lesions throughout the liver, too small to characterize. There is a subcapsular focus of low-density in the anterior medial segment left liver lobe adjacent to the intersegmental fissure, favor focal fat. Normal gallbladder with no radiopaque cholelithiasis. No biliary ductal dilatation. Pancreas: Normal, with no mass or duct dilation.  Spleen: Normal size. No mass. Adrenals/Urinary Tract: Normal adrenals. There is symmetric excreted contrast in the bilateral renal collecting systems. No hydronephrosis. No renal mass. Stomach/Bowel: Tiny hiatal hernia. Otherwise collapsed and grossly normal stomach. Visualized small and large bowel is normal caliber, with no bowel wall thickening. Vascular/Lymphatic: Normal caliber abdominal aorta. No pathologically enlarged lymph nodes in the abdomen. Other: No pneumoperitoneum, ascites or focal fluid collection. No evidence of a retroperitoneal hematoma. Musculoskeletal: No aggressive appearing focal osseous lesions. Marked degenerative changes in the lumbar spine. IMPRESSION: 1. New central low-density filling defect in the right internal jugular vein, suspicious for acute deep venous thrombosis. Recommend correlation with right upper extremity venous Doppler scan. 2. New wedge shaped subpleural foci of patchy consolidation with surrounding ground-glass opacity in the right lower lobe. New patchy consolidation and ground-glass opacity in the lingula and left lower lobe. Findings are nonspecific and could represent multifocal pneumonia, however the possibility of pulmonary infarcts due to pulmonary embolism is not excluded. Correlate with chest CT angiography or V/Q scan as indicated. 3. Small left greater than right layering bilateral pleural effusions, decreased on the right and increased on the left, with associated passive lower lobe atelectasis. 4. Stable findings of mild congestive heart failure including mild cardiomegaly and mild pulmonary edema. 5. Minimal pericardial fluid/thickening, slightly increased. 6. No acute abnormality in the abdomen. No evidence of a retroperitoneal hematoma. Tiny hiatal hernia. These results will be called to the ordering clinician or representative by the Radiologist Assistant, and communication documented in the PACS or zVision Dashboard. Electronically Signed   By: Delbert Phenix M.D.   On: 12/31/2015 09:17   Dg Chest 2 View  12/31/2015  CLINICAL DATA:  Pt having a VQ lung scan today. She is also having right sided chest pain today. Hx of fibromyalgia. Former smoker; quit 2014 EXAM: CHEST  2 VIEW COMPARISON:  Chest CT 12/31/2015, chest x-ray 12/27/2015 FINDINGS: Heart margins are obscured by bibasilar opacity. There are bilateral pleural effusions. Left lung base opacity consistent with atelectasis or infiltrate and appear stable. No evidence for pulmonary edema. Biapical pleural parenchymal thickening appears stable. IMPRESSION: Persistent significant opacity at the left lung base. Bilateral pleural effusions, left greater than right. Electronically Signed   By: Norva Pavlov M.D.   On: 12/31/2015 13:12   Dg Chest 2 View  12/27/2015  CLINICAL DATA:  Shortness of breath and chest pain for 2 months EXAM: CHEST  2 VIEW COMPARISON:  Chest CT December 20, 2015 FINDINGS: There is a sizable left pleural effusion with atelectasis/ consolidation in portions of the lingula and left lower lobe. There is a minimal right effusion. The right lung is otherwise clear. Heart is upper normal in size with pulmonary vascular within normal limits. No adenopathy. No bone lesions. IMPRESSION: Sizable left pleural effusion with atelectasis/  consolidation in portions of the lingula and left lower lobe. There is increased opacity in this area compared to recent CT. Minimal right effusion. Right lung otherwise clear. No change in cardiac silhouette. Electronically Signed   By: Bretta Bang III M.D.   On: 12/27/2015 09:14   Ct Chest Without Contrast  12/31/2015  CLINICAL DATA:  Left chest and abdominal pain status post cardiac catheterization. CHF. EXAM: CT CHEST AND ABDOMEN WITHOUT CONTRAST TECHNIQUE: Multidetector CT imaging of the chest and abdomen was performed following the standard protocol without IV contrast. COMPARISON:  12/20/2015 chest CT . FINDINGS: CT CHEST Mediastinum/Nodes: Stable  mild cardiomegaly. Minimal pericardial fluid/ thickening, slightly increased. Great vessels are normal in course and caliber. Stable hypodense 1.1 cm anterior right thyroid lobe nodule and 1.0 cm coarsely calcified anterior left thyroid lobe nodule. Normal esophagus. No pathologically enlarged axillary, mediastinal or gross hilar lymph nodes, noting limited sensitivity for the detection of hilar adenopathy on this noncontrast study. There is a new central low-density filling defect within the right internal jugular vein, suspicious for acute deep venous thrombosis. Lungs/Pleura: No pneumothorax. Small layering right pleural effusion, decreased since 12/19/2015. Small layering left pleural effusion, increased since 12/20/2015. There are new wedge-shaped subpleural focus of patchy consolidation with surrounding ground-glass opacity in the anterior right lower lobe (series 4/ image 43) and basilar right lower lobe (series 4/ image 59). There is new patchy consolidation and ground-glass opacity in the lingula and left lower lobe, with components of passive atelectasis in the lingula and left lower lobe. Mild interlobular septal thickening in the upper lobes likely represents mild pulmonary edema. Musculoskeletal: No aggressive appearing focal osseous lesions. Marked degenerative changes in the thoracic spine. CT ABDOMEN Hepatobiliary: There is a 1.0 cm simple liver cyst in the segment 4B left liver lobe. There are at least 3 additional scattered subcentimeter hypodense lesions throughout the liver, too small to characterize. There is a subcapsular focus of low-density in the anterior medial segment left liver lobe adjacent to the intersegmental fissure, favor focal fat. Normal gallbladder with no radiopaque cholelithiasis. No biliary ductal dilatation. Pancreas: Normal, with no mass or duct dilation. Spleen: Normal size. No mass. Adrenals/Urinary Tract: Normal adrenals. There is symmetric excreted contrast in the  bilateral renal collecting systems. No hydronephrosis. No renal mass. Stomach/Bowel: Tiny hiatal hernia. Otherwise collapsed and grossly normal stomach. Visualized small and large bowel is normal caliber, with no bowel wall thickening. Vascular/Lymphatic: Normal caliber abdominal aorta. No pathologically enlarged lymph nodes in the abdomen. Other: No pneumoperitoneum, ascites or focal fluid collection. No evidence of a retroperitoneal hematoma. Musculoskeletal: No aggressive appearing focal osseous lesions. Marked degenerative changes in the lumbar spine. IMPRESSION: 1. New central low-density filling defect in the right internal jugular vein, suspicious for acute deep venous thrombosis. Recommend correlation with right upper extremity venous Doppler scan. 2. New wedge shaped subpleural foci of patchy consolidation with surrounding ground-glass opacity in the right lower lobe. New patchy consolidation and ground-glass opacity in the lingula and left lower lobe. Findings are nonspecific and could represent multifocal pneumonia, however the possibility of pulmonary infarcts due to pulmonary embolism is not excluded. Correlate with chest CT angiography or V/Q scan as indicated. 3. Small left greater than right layering bilateral pleural effusions, decreased on the right and increased on the left, with associated passive lower lobe atelectasis. 4. Stable findings of mild congestive heart failure including mild cardiomegaly and mild pulmonary edema. 5. Minimal pericardial fluid/thickening, slightly increased. 6. No acute abnormality in the  abdomen. No evidence of a retroperitoneal hematoma. Tiny hiatal hernia. These results will be called to the ordering clinician or representative by the Radiologist Assistant, and communication documented in the PACS or zVision Dashboard. Electronically Signed   By: Delbert Phenix M.D.   On: 12/31/2015 09:17   Nm Myocar Multi W/spect W/wall Motion / Ef  01/09/2016   The study is  normal.  This is a low risk study.  The left ventricular ejection fraction is normal (55-65%).  There was no ST segment deviation noted during stress.    Nm Pulmonary Perf And Vent  12/31/2015  CLINICAL DATA:  Chest pain at rest. EXAM: NUCLEAR MEDICINE VENTILATION - PERFUSION LUNG SCAN TECHNIQUE: Ventilation images were obtained in multiple projections using inhaled aerosol Tc-70m DTPA. Perfusion images were obtained in multiple projections after intravenous injection of Tc-58m MAA. RADIOPHARMACEUTICALS:  32.46 millicuries of Technetium-60m DTPA aerosol inhalation and 3.92 millicuries of Technetium-58m MAA IV COMPARISON:  Chest x-ray dated 12/31/2015 FINDINGS: Ventilation: There is decreased perfusion at the left lung base due to the large effusion seen on chest x-ray. The ventilation exam is otherwise normal. Perfusion: There is slightly decreased profusion at the left lung base due to the effusion. There is a wedge-shaped perfusion defect at the right lung base which is felt to be due to fluid along the major fissure due to the small right effusion. IMPRESSION: No findings suggestive of pulmonary embolism. Bilateral pleural effusions, left more than right. Electronically Signed   By: Francene Boyers M.D.   On: 12/31/2015 14:44   US Venous Img Upper Uni Right  12/31/2015  CLINICAL DATA:  DVT. EXAM: RIGHT UPPER EXTREMITY VENOUS DOPPLER ULTRASOUND TECHNIQUE: Gray-scale sonography with graded compression, as well as color Doppler and duplex ultrasound were performed to evaluate the upper extremity deep venous system from the level of the subclavian vein and including the jugular, axillary, basilic, radial, ulnar and upper cephalic vein. Spectral Doppler was utilized to evaluate flow at rest and with distal augmentation maneuvers. COMPARISON:  CT 12/31/2015 FINDINGS: Contralateral Subclavian Vein: Respiratory phasicity is normal and symmetric with the symptomatic side. No evidence of thrombus. Normal  compressibility. Internal Jugular Vein: Occlusive noncompressible thrombus. Subclavian Vein: Occlusive noncompressible thrombus. Axillary Vein: No evidence of thrombus. Normal compressibility, respiratory phasicity and response to augmentation. Cephalic Vein: No evidence of thrombus. Normal compressibility, respiratory phasicity and response to augmentation. Basilic Vein: Near occlusive thrombus with decreased compressibility. Brachial Veins: No evidence of thrombus. Normal compressibility, respiratory phasicity and response to augmentation. Radial Veins: No evidence of thrombus. Normal compressibility, respiratory phasicity and response to augmentation. Ulnar Veins: No evidence of thrombus. Normal compressibility, respiratory phasicity and response to augmentation. Other Findings:  None visualized. IMPRESSION: Positive study for occlusive thrombosis of the right internal jugular vein and right subclavian vein . Near occlusive thrombus is noted in the basilic vein. These results will be called to the ordering clinician or representative by the Radiologist Assistant, and communication documented in the PACS or zVision Dashboard. Electronically Signed   By: Maisie Fus  Register   On: 12/31/2015 12:05    ASSESSMENT: Right internal jugular and subclavian DVT, pulmonary embolus. Diagnosed in January 2017.  PLAN:    1. DVT/PE: Although patient has a family history of factor V Leiden, her entire hypercoagulable workup including factor V 5 Leiden is negative. Unclear etiology of patient's DVT and PE. She does not appear to have any transient risk factors.  She has been instructed to continue Eliquis which will she will require  a six-month treatment course. Return to clinic in 4 months to discuss her laboratory workup and whether or not to continue additional anticoagulation.  Approximately 30 minutes was spent in discussion of which 50% was consultation.  Patient expressed understanding and was in agreement with this  plan. She also understands that She can call clinic at any time with any questions, concerns, or complaints.    Jeralyn Ruths, MD   01/24/2016 5:18 PM

## 2016-02-06 ENCOUNTER — Encounter: Payer: Self-pay | Admitting: Physician Assistant

## 2016-02-07 ENCOUNTER — Encounter: Payer: Self-pay | Admitting: Physician Assistant

## 2016-02-12 ENCOUNTER — Ambulatory Visit: Payer: BLUE CROSS/BLUE SHIELD | Attending: Family | Admitting: Family

## 2016-02-12 ENCOUNTER — Encounter: Payer: Self-pay | Admitting: Family

## 2016-02-12 VITALS — BP 116/70 | HR 71 | Resp 18 | Ht 69.0 in | Wt 170.0 lb

## 2016-02-12 DIAGNOSIS — I5022 Chronic systolic (congestive) heart failure: Secondary | ICD-10-CM | POA: Diagnosis present

## 2016-02-12 DIAGNOSIS — R002 Palpitations: Secondary | ICD-10-CM | POA: Diagnosis not present

## 2016-02-12 DIAGNOSIS — M797 Fibromyalgia: Secondary | ICD-10-CM | POA: Insufficient documentation

## 2016-02-12 DIAGNOSIS — F419 Anxiety disorder, unspecified: Secondary | ICD-10-CM | POA: Diagnosis not present

## 2016-02-12 DIAGNOSIS — F329 Major depressive disorder, single episode, unspecified: Secondary | ICD-10-CM | POA: Diagnosis not present

## 2016-02-12 DIAGNOSIS — Z87891 Personal history of nicotine dependence: Secondary | ICD-10-CM | POA: Insufficient documentation

## 2016-02-12 DIAGNOSIS — Z79899 Other long term (current) drug therapy: Secondary | ICD-10-CM | POA: Insufficient documentation

## 2016-02-12 DIAGNOSIS — R000 Tachycardia, unspecified: Secondary | ICD-10-CM | POA: Diagnosis not present

## 2016-02-12 DIAGNOSIS — F32A Depression, unspecified: Secondary | ICD-10-CM

## 2016-02-12 DIAGNOSIS — Z7901 Long term (current) use of anticoagulants: Secondary | ICD-10-CM | POA: Insufficient documentation

## 2016-02-12 DIAGNOSIS — Z9889 Other specified postprocedural states: Secondary | ICD-10-CM | POA: Insufficient documentation

## 2016-02-12 LAB — BASIC METABOLIC PANEL
Anion gap: 3 — ABNORMAL LOW (ref 5–15)
BUN: 16 mg/dL (ref 6–20)
CO2: 29 mmol/L (ref 22–32)
CREATININE: 0.75 mg/dL (ref 0.44–1.00)
Calcium: 9 mg/dL (ref 8.9–10.3)
Chloride: 106 mmol/L (ref 101–111)
GFR calc Af Amer: >60 mL/min (ref >60)
GLUCOSE: 87 mg/dL (ref 65–99)
Potassium: 3.7 mmol/L (ref 3.5–5.1)
SODIUM: 138 mmol/L (ref 135–145)

## 2016-02-12 MED ORDER — CARVEDILOL 3.125 MG PO TABS: 6.1250 mg | ORAL_TABLET | Freq: Two times a day (BID) | ORAL | Status: DC

## 2016-02-12 MED ORDER — SACUBITRIL-VALSARTAN 24-26 MG PO TABS
1.0000 | ORAL_TABLET | Freq: Two times a day (BID) | ORAL | Status: DC
Start: 2016-02-12 — End: 2016-02-18

## 2016-02-12 NOTE — Patient Instructions (Addendum)
Continue weighing daily and call for an overnight weight gain of > 2 pounds or a weekly weight gain of >5 pounds.  Increase carvedilol to 2 tablets twice daily. (Total of 6.25mg  twice daily) Please call when you get close to finishing your current prescription so that I can call in the next higher dose.

## 2016-02-12 NOTE — Progress Notes (Signed)
Subjective:    Patient ID: Maureen Grant, female    DOB: 1956/03/17, 60 y.o.   MRN: [id number]  Congestive Heart Failure Presents for follow-up visit. The disease course has been improving. Associated symptoms include fatigue and palpitations (on occasion). Pertinent negatives include no abdominal pain, chest pain, edema, orthopnea or shortness of breath. The symptoms have been improving. Past treatments include angiotensin receptor blockers, beta blockers and salt and fluid restriction. The treatment provided moderate relief. Compliance with prior treatments has been good. There is no history of chronic lung disease, CVA or HTN. She has multiple 1st degree relatives with heart disease. Compliance with total regimen is 76-100%.  Other This is a recurrent (tachycardia) problem. The current episode started more than 1 year ago. The problem occurs intermittently. The problem has been gradually improving. Associated symptoms include fatigue. Pertinent negatives include no abdominal pain, chest pain, congestion, coughing, headaches, neck pain, numbness, sore throat or weakness. Nothing aggravates the symptoms.    Past Medical History  Diagnosis Date  . Depression   . DDD (degenerative disc disease), cervical   . Anxiety   . Fibromyalgia   . CHF (congestive heart failure) [facility])     Past Surgical History  Procedure Laterality Date  . Hx of rotator cuff surgery Right 10/07/2001  . Cardiac catheterization N/A 12/31/2015    Procedure: Right/Left Heart Cath and Coronary Angiography;  Surgeon: Lamar Blinks, MD;  Location: [facility] INVASIVE CV LAB;  Service: Cardiovascular;  Laterality: N/A;    Family History  Problem Relation Age of Onset  . Arthritis Mother   . Heart disease Mother   . Clotting disorder Mother   . Depression Sister   . Cancer Maternal Grandmother   . Cancer Maternal Grandfather     Lung  . Alcohol abuse Maternal Grandfather   . Diabetes Paternal Grandmother   . Dementia  Paternal Grandmother   . Cancer Sister   . Thyroid disease Sister   . Heart disease Father   . CVA Father   . Alcohol abuse Father     Social History  Substance Use Topics  . Smoking status: Former Smoker -- 0.50 packs/day for 45 years    Types: E-cigarettes, Cigarettes    Quit date: 12/02/2015  . Smokeless tobacco: Never Used  . Alcohol Use: No    Allergies  Allergen Reactions  . Latex Dermatitis    Prior to Admission medications   Medication Sig Start Date End Date Taking? Authorizing Provider  acetaminophen (TYLENOL) 325 MG tablet Take 2 tablets (650 mg total) by mouth every 4 (four) hours as needed for headache or mild pain. 01/02/16  Yes Ramonita Lab, MD  apixaban (ELIQUIS) 5 MG TABS tablet Take 5 mg by mouth 2 (two) times daily.   Yes Historical Provider, MD  carvedilol (COREG) 3.125 MG tablet Take 2 tablets (6.25 mg total) by mouth 2 (two) times daily with a meal. 02/12/16  Yes Delma Freeze, FNP  diphenhydrAMINE (BENADRYL) 25 mg capsule Take 1 capsule (25 mg total) by mouth at bedtime as needed for sleep. 01/02/16  Yes Ramonita Lab, MD  PROVENTIL HFA 108 [phone] Base) MCG/ACT inhaler Reported on 01/15/2016 12/04/15  Yes Historical Provider, MD  sacubitril-valsartan (ENTRESTO) 24-26 MG Take 1 tablet by mouth 2 (two) times daily. 02/12/16  Yes Delma Freeze, FNP  torsemide (DEMADEX) 20 MG tablet Take 0.5 tablets (10 mg total) by mouth daily as needed. 01/21/16  Yes Delma Freeze, FNP  venlafaxine XR [facility])  150 MG 24 hr capsule Take 1 capsule (150 mg total) by mouth daily with breakfast. 01/16/16  Yes Margaretann LovelessJennifer M Burnette, PA-C     Review of Systems  Constitutional: Positive for appetite change (not great) and fatigue.  HENT: Negative for congestion, rhinorrhea and sore throat.   Eyes: Negative.   Respiratory: Negative for cough, chest tightness and shortness of breath.   Cardiovascular: Positive for palpitations (on occasion). Negative for chest pain and leg swelling.   Gastrointestinal: Negative for abdominal pain and abdominal distention.  Endocrine: Negative.   Genitourinary: Negative.   Musculoskeletal: Positive for back pain (mild lower back). Negative for neck pain.  Skin: Negative.   Allergic/Immunologic: Negative.   Neurological: Positive for light-headedness (when changing positions too quickly). Negative for dizziness, weakness, numbness and headaches.  Hematological: Negative for adenopathy. Does not bruise/bleed easily.  Psychiatric/Behavioral: Positive for sleep disturbance (trouble falling asleep). Negative for dysphoric mood. The patient is nervous/anxious.        Objective:   Physical Exam  Constitutional: She is oriented to person, place, and time. She appears well-developed and well-nourished.  HENT:  Head: Normocephalic and atraumatic.  Eyes: Conjunctivae are normal. Pupils are equal, round, and reactive to light.  Neck: Normal range of motion. Neck supple.  Cardiovascular: Normal rate and regular rhythm.   Pulmonary/Chest: Effort normal. She has no wheezes. She has no rales.  Abdominal: Soft. She exhibits no distension. There is no tenderness.  Musculoskeletal: She exhibits no edema or tenderness.  Neurological: She is alert and oriented to person, place, and time.  Skin: Skin is warm and dry.  Psychiatric: She has a normal mood and affect. Her behavior is normal. Thought content normal.  Nursing note and vitals reviewed.   BP 116/70 mmHg  Pulse 71  Resp 18  Ht 5\' 9"  (1.753 m)  Wt 170 lb (77.111 kg)  BMI 25.09 kg/m2  SpO2 100%       Assessment & Plan:  1: Chronic heart failure with reduced ejection fraction- Patient presents with fatigue upon exertion (Class II). She says that when she does get tired, she will stop what she's doing to rest until her energy level improves. Denies any shortness of breath or swelling in her legs/abdomen. She continues to weigh herself daily and says that her weight has been stable. By our  scale, her weight is unchanged. Reminded to call for an overnight weight gain of >2 pounds or a weekly weight gain of >5 pounds. She is not adding salt to her food and tries to follow a low sodium diet. She has tolerated the entresto without known side effects so a new prescription was sent to her local pharmacy and 14 samples were given to her (F9006, EXP 3/17). Will check a basic metabolic panel today. Could consider increasing dose at her next visit. [facility] and reviewed medications with the patient.  2: Tachycardia- Heart rate looks better. Will increase her carvedilol to 6.25mg  twice daily. She was instructed to take 2 of her 3.125mg  tablets twice daily until they get close to being gone. She's to then call us so that a prescription for the 6.25mg  dose can be sent in. Written instructions were given to her about this and patient verbalizes understanding of the change. 3: Depression- She has gotten back on her effexor and reports doing well with this. Is supposed to see her work doctor tomorrow to get cleared for returning back to work. She feels like she will sleep better once  she gets back to work and is busy all day.  Return here in 1 month or sooner for any questions/problems before then.

## 2016-02-17 ENCOUNTER — Encounter: Payer: Self-pay | Admitting: Family

## 2016-02-18 ENCOUNTER — Other Ambulatory Visit: Payer: Self-pay

## 2016-02-18 MED ORDER — SACUBITRIL-VALSARTAN 24-26 MG PO TABS: 1.0000 | ORAL_TABLET | Freq: Two times a day (BID) | ORAL | Status: DC

## 2016-02-21 ENCOUNTER — Encounter: Payer: Self-pay | Admitting: Family

## 2016-03-17 ENCOUNTER — Ambulatory Visit: Payer: BLUE CROSS/BLUE SHIELD | Attending: Family | Admitting: Family

## 2016-03-17 ENCOUNTER — Encounter: Payer: Self-pay | Admitting: Family

## 2016-03-17 VITALS — BP 119/66 | HR 83 | Resp 18 | Ht 69.0 in | Wt 168.0 lb

## 2016-03-17 DIAGNOSIS — M5431 Sciatica, right side: Secondary | ICD-10-CM | POA: Diagnosis not present

## 2016-03-17 DIAGNOSIS — Z87891 Personal history of nicotine dependence: Secondary | ICD-10-CM | POA: Insufficient documentation

## 2016-03-17 DIAGNOSIS — M797 Fibromyalgia: Secondary | ICD-10-CM | POA: Diagnosis not present

## 2016-03-17 DIAGNOSIS — Z7901 Long term (current) use of anticoagulants: Secondary | ICD-10-CM | POA: Diagnosis not present

## 2016-03-17 DIAGNOSIS — F419 Anxiety disorder, unspecified: Secondary | ICD-10-CM | POA: Insufficient documentation

## 2016-03-17 DIAGNOSIS — F329 Major depressive disorder, single episode, unspecified: Secondary | ICD-10-CM | POA: Insufficient documentation

## 2016-03-17 DIAGNOSIS — I5022 Chronic systolic (congestive) heart failure: Secondary | ICD-10-CM | POA: Insufficient documentation

## 2016-03-17 DIAGNOSIS — I82721 Chronic embolism and thrombosis of deep veins of right upper extremity: Secondary | ICD-10-CM

## 2016-03-17 DIAGNOSIS — Z79899 Other long term (current) drug therapy: Secondary | ICD-10-CM | POA: Insufficient documentation

## 2016-03-17 MED ORDER — CARVEDILOL 12.5 MG PO TABS: 12.5000 mg | ORAL_TABLET | Freq: Two times a day (BID) | ORAL | Status: DC

## 2016-03-17 NOTE — Patient Instructions (Signed)
Continue weighing daily and call for an overnight weight gain of > 2 pounds or a weekly weight gain of >5 pounds. 

## 2016-03-17 NOTE — Progress Notes (Signed)
Subjective:    Patient ID: Maureen Grant, female    DOB: 1956/07/30, 60 y.o.   MRN: [id number]  Congestive Heart Failure Presents for follow-up visit. The disease course has been improving. Associated symptoms include fatigue. Pertinent negatives include no abdominal pain, chest pain, chest pressure, edema, orthopnea, palpitations or shortness of breath. The symptoms have been improving. Past treatments include angiotensin receptor blockers, beta blockers and salt and fluid restriction. The treatment provided significant relief. Compliance with prior treatments has been good. There is no history of CAD, CVA or DM. She has one 1st degree relative with heart disease.  Back Pain This is a recurrent problem. The current episode started in the past 7 days. The problem occurs constantly. The problem has been gradually worsening since onset. The pain is present in the lumbar spine and sacro-iliac. The quality of the pain is described as burning and aching. The pain radiates to the right thigh and right knee. The pain is at a severity of 8/10. The pain is moderate. The pain is worse during the day. The symptoms are aggravated by sitting and standing. Stiffness is present in the morning. Associated symptoms include leg pain and weakness (in legs). Pertinent negatives include no abdominal pain, chest pain, dysuria, headaches, numbness or paresthesias. She has tried analgesics, heat, ice, muscle relaxant and NSAIDs for the symptoms. The treatment provided no relief.    Past Medical History  Diagnosis Date  . Depression   . DDD (degenerative disc disease), cervical   . Anxiety   . Fibromyalgia   . CHF (congestive heart failure) [facility])     Past Surgical History  Procedure Laterality Date  . Hx of rotator cuff surgery Right 10/07/2001  . Cardiac catheterization N/A 12/31/2015    Procedure: Right/Left Heart Cath and Coronary Angiography;  Surgeon: Lamar Blinks, MD;  Location: [facility] INVASIVE CV LAB;   Service: Cardiovascular;  Laterality: N/A;    Family History  Problem Relation Age of Onset  . Arthritis Mother   . Heart disease Mother   . Clotting disorder Mother   . Depression Sister   . Cancer Maternal Grandmother   . Cancer Maternal Grandfather     Lung  . Alcohol abuse Maternal Grandfather   . Diabetes Paternal Grandmother   . Dementia Paternal Grandmother   . Cancer Sister   . Thyroid disease Sister   . Heart disease Father   . CVA Father   . Alcohol abuse Father     Social History  Substance Use Topics  . Smoking status: Former Smoker -- 0.50 packs/day for 45 years    Types: E-cigarettes, Cigarettes    Quit date: 12/02/2015  . Smokeless tobacco: Never Used  . Alcohol Use: No    Allergies  Allergen Reactions  . Latex Dermatitis    Prior to Admission medications   Medication Sig Start Date End Date Taking? Authorizing Provider  acetaminophen (TYLENOL) 325 MG tablet Take 2 tablets (650 mg total) by mouth every 4 (four) hours as needed for headache or mild pain. 01/02/16  Yes Ramonita Lab, MD  apixaban (ELIQUIS) 5 MG TABS tablet Take 5 mg by mouth 2 (two) times daily.   Yes Historical Provider, MD  diphenhydrAMINE (BENADRYL) 25 mg capsule Take 1 capsule (25 mg total) by mouth at bedtime as needed for sleep. 01/02/16  Yes Ramonita Lab, MD  meloxicam (MOBIC) 15 MG tablet Take 15 mg by mouth daily as needed for pain.   Yes Historical Provider, MD  methocarbamol (ROBAXIN) 750 MG tablet Take 750 mg by mouth daily.   Yes Historical Provider, MD  PROVENTIL HFA 108 [phone](90 Base) MCG/ACT inhaler Reported on 01/15/2016 12/04/15  Yes Historical Provider, MD  sacubitril-valsartan (ENTRESTO) 24-26 MG Take 1 tablet by mouth 2 (two) times daily. 02/18/16  Yes Delma Freezeina A Hooper [place]etteway, FNP  torsemide (DEMADEX) 20 MG tablet Take 0.5 tablets (10 mg total) by mouth daily as needed. 01/21/16  Yes Delma Freezeina A Avian [place]alt, FNP  venlafaxine XR (EFFEXOR-XR) 150 MG 24 hr capsule Take 1 capsule (150 mg total) by mouth daily  with breakfast. 01/16/16  Yes Alessandra BevelsJennifer M Burnette, PA-C  carvedilol (COREG) 12.5 MG tablet Take 1 tablet (12.5 mg total) by mouth 2 (two) times daily. 03/17/16   Delma Freezeina A Aima Mcwhirt, FNP     Review of Systems  Constitutional: Positive for fatigue. Negative for appetite change.  HENT: Positive for congestion. Negative for postnasal drip and sore throat.   Eyes: Negative.   Respiratory: Negative for cough, chest tightness, shortness of breath and wheezing.   Cardiovascular: Negative for chest pain, palpitations and leg swelling.  Gastrointestinal: Negative for abdominal pain and abdominal distention.  Endocrine: Negative.   Genitourinary: Negative.  Negative for dysuria.  Musculoskeletal: Positive for back pain (lower) and arthralgias (down the right leg). Negative for neck pain.  Skin: Negative.   Allergic/Immunologic: Negative.   Neurological: Positive for weakness (in legs). Negative for dizziness, light-headedness, numbness, headaches and paresthesias.  Hematological: Negative for adenopathy. Does not bruise/bleed easily.  Psychiatric/Behavioral: Negative for sleep disturbance (sleeping on 1 pillow) and dysphoric mood. The patient is not nervous/anxious.        Objective:   Physical Exam  Constitutional: She is oriented to person, place, and time. She appears well-developed and well-nourished.  HENT:  Head: Normocephalic and atraumatic.  Eyes: Conjunctivae are normal. Pupils are equal, round, and reactive to light.  Neck: Normal range of motion. Neck supple.  Cardiovascular: Normal rate and regular rhythm.   Pulmonary/Chest: Effort normal. She has no wheezes. She has no rales.  Abdominal: Soft. She exhibits no distension. There is no tenderness.  Musculoskeletal: She exhibits no edema or tenderness.  Neurological: She is alert and oriented to person, place, and time.  Skin: Skin is warm and dry.  Psychiatric: She has a normal mood and affect. Her behavior is normal. Thought content  normal.  Nursing note and vitals reviewed.  BP 119/66 mmHg  Pulse 83  Resp 18  Ht 5\' 9"  (1.753 m)  Wt 168 lb (76.204 kg)  BMI 24.80 kg/m2  SpO2 100%        Assessment & Plan:  1: Chronic heart failure with reduced ejection fraction- Patient presents with fatigue upon exertion (Class II) but feels like the fatigue is improving. She has resumed working and is able to work without much difficulty. Denied being tired upon walking into the office today. Denies any shortness of breath or swelling in her ankles/abdomen. She continues to weigh herself and says that her weight has been stable. By our scale, she has lost 2 pounds since she was last here on 02/12/16. Reminded to call for an overnight weight gain of >2 pounds or a weekly weight gain of >5 pounds. She is not adding any salt to her food and is trying to closely follow a low sodium diet. Is reading food labels. Discussed increasing her entresto but she would like to leave it at the current dosage because she's concerned about the cost of the medication. She  is willing to titrate up her carvedilol so will increase her carvedilol to 12.5mg  twice daily. She is to continue taking 2 of her 3.125mg  tablets twice daily until the 12.5mg  dosage gets delivered and then she will take the 12.5mg  dosage twice daily. Discussed going up further to get her to the goal dose of  twice daily if possible.  2: Chronic DVT- She continues to take the eliquis twice daily and is hoping to get off of that in the near future. No bruising to report. 3: Sciatica, right side- She says that this flared a few days ago and seems to be getting worse. She has resumed taking her meloxicam and robaxin and has tried heat and ice but without much relief. She is going to call her chiropractor to see if she can get an appointment either today or tomorrow. Encouraged her to also followup with her PCP if this doesn't improve.  Patient did not bring all her medications nor a list.  Each medication was verbally reviewed with the patient and she was encouraged to bring the bottles to every visit to confirm accuracy of list.  Return here in 1 month or sooner for any questions/problems before then.

## 2016-04-16 ENCOUNTER — Ambulatory Visit: Payer: BLUE CROSS/BLUE SHIELD | Attending: Family | Admitting: Family

## 2016-04-16 ENCOUNTER — Encounter: Payer: Self-pay | Admitting: Family

## 2016-04-16 VITALS — BP 106/53 | HR 66 | Resp 18 | Ht 69.0 in | Wt 169.0 lb

## 2016-04-16 DIAGNOSIS — Z832 Family history of diseases of the blood and blood-forming organs and certain disorders involving the immune mechanism: Secondary | ICD-10-CM | POA: Insufficient documentation

## 2016-04-16 DIAGNOSIS — Z8349 Family history of other endocrine, nutritional and metabolic diseases: Secondary | ICD-10-CM | POA: Insufficient documentation

## 2016-04-16 DIAGNOSIS — Z8261 Family history of arthritis: Secondary | ICD-10-CM | POA: Diagnosis not present

## 2016-04-16 DIAGNOSIS — F329 Major depressive disorder, single episode, unspecified: Secondary | ICD-10-CM | POA: Diagnosis not present

## 2016-04-16 DIAGNOSIS — M545 Low back pain, unspecified: Secondary | ICD-10-CM

## 2016-04-16 DIAGNOSIS — Z801 Family history of malignant neoplasm of trachea, bronchus and lung: Secondary | ICD-10-CM | POA: Insufficient documentation

## 2016-04-16 DIAGNOSIS — Z7902 Long term (current) use of antithrombotics/antiplatelets: Secondary | ICD-10-CM | POA: Insufficient documentation

## 2016-04-16 DIAGNOSIS — Z87891 Personal history of nicotine dependence: Secondary | ICD-10-CM | POA: Diagnosis not present

## 2016-04-16 DIAGNOSIS — I82721 Chronic embolism and thrombosis of deep veins of right upper extremity: Secondary | ICD-10-CM

## 2016-04-16 DIAGNOSIS — Z91040 Latex allergy status: Secondary | ICD-10-CM | POA: Insufficient documentation

## 2016-04-16 DIAGNOSIS — Z9889 Other specified postprocedural states: Secondary | ICD-10-CM | POA: Diagnosis not present

## 2016-04-16 DIAGNOSIS — Z79899 Other long term (current) drug therapy: Secondary | ICD-10-CM | POA: Insufficient documentation

## 2016-04-16 DIAGNOSIS — Z818 Family history of other mental and behavioral disorders: Secondary | ICD-10-CM | POA: Insufficient documentation

## 2016-04-16 DIAGNOSIS — Z811 Family history of alcohol abuse and dependence: Secondary | ICD-10-CM | POA: Diagnosis not present

## 2016-04-16 DIAGNOSIS — I5022 Chronic systolic (congestive) heart failure: Secondary | ICD-10-CM | POA: Insufficient documentation

## 2016-04-16 DIAGNOSIS — Z8249 Family history of ischemic heart disease and other diseases of the circulatory system: Secondary | ICD-10-CM | POA: Diagnosis not present

## 2016-04-16 DIAGNOSIS — F419 Anxiety disorder, unspecified: Secondary | ICD-10-CM | POA: Insufficient documentation

## 2016-04-16 DIAGNOSIS — M5030 Other cervical disc degeneration, unspecified cervical region: Secondary | ICD-10-CM | POA: Diagnosis not present

## 2016-04-16 DIAGNOSIS — Z791 Long term (current) use of non-steroidal anti-inflammatories (NSAID): Secondary | ICD-10-CM | POA: Diagnosis not present

## 2016-04-16 DIAGNOSIS — M797 Fibromyalgia: Secondary | ICD-10-CM | POA: Insufficient documentation

## 2016-04-16 DIAGNOSIS — R42 Dizziness and giddiness: Secondary | ICD-10-CM | POA: Insufficient documentation

## 2016-04-16 NOTE — Progress Notes (Signed)
Subjective:    Patient ID: Maureen DueConnie J Grant, female    DOB: Apr 22, 1956, 60 y.o.   MRN: [id number]  Congestive Heart Failure Presents for follow-up visit. The disease course has been stable. Associated symptoms include fatigue (on work days). Pertinent negatives include no abdominal pain, chest pain, chest pressure, edema, orthopnea or palpitations. The symptoms have been stable. Past treatments include angiotensin receptor blockers, beta blockers and salt and fluid restriction. The treatment provided significant relief. Compliance with prior treatments has been good. There is no history of anemia, CVA, DM or HTN. She has multiple 1st degree relatives with heart disease. Compliance with total regimen is 76-100%.  Other This is a recurrent (fatigue) problem. The current episode started 1 to 4 weeks ago. The problem occurs every several days. The problem has been waxing and waning. Associated symptoms include fatigue (on work days). Pertinent negatives include no abdominal pain, chest pain, congestion, fever, headaches, neck pain, numbness, sore throat or weakness. The symptoms are aggravated by exertion. She has tried rest for the symptoms. The treatment provided moderate relief.  Back Pain This is a new problem. The current episode started more than 1 month ago. The problem occurs intermittently. The problem is unchanged. The pain is present in the lumbar spine. The quality of the pain is described as aching. Radiates to: around both sides into abdomen. The pain is at a severity of 6/10. The pain is moderate. The pain is worse during the day. The symptoms are aggravated by bending and standing. Pertinent negatives include no abdominal pain, chest pain, dysuria, fever, headaches, numbness, paresthesias, pelvic pain or weakness. Risk factors include poor posture and menopause. She has tried home exercises for the symptoms. The treatment provided mild relief.   Past Medical History  Diagnosis Date  .  Depression   . DDD (degenerative disc disease), cervical   . Anxiety   . Fibromyalgia   . CHF (congestive heart failure) [facility])     Past Surgical History  Procedure Laterality Date  . Hx of rotator cuff surgery Right 10/07/2001  . Cardiac catheterization N/A 12/31/2015    Procedure: Right/Left Heart Cath and Coronary Angiography;  Surgeon: Lamar BlinksBruce J Kowalski, MD;  Location: [facility] INVASIVE CV LAB;  Service: Cardiovascular;  Laterality: N/A;    Family History  Problem Relation Age of Onset  . Arthritis Mother   . Heart disease Mother   . Clotting disorder Mother   . Depression Sister   . Cancer Maternal Grandmother   . Cancer Maternal Grandfather     Lung  . Alcohol abuse Maternal Grandfather   . Diabetes Paternal Grandmother   . Dementia Paternal Grandmother   . Cancer Sister   . Thyroid disease Sister   . Heart disease Father   . CVA Father   . Alcohol abuse Father     Social History  Substance Use Topics  . Smoking status: Former Smoker -- 0.50 packs/day for 45 years    Types: E-cigarettes, Cigarettes    Quit date: 12/02/2015  . Smokeless tobacco: Never Used  . Alcohol Use: No    Allergies  Allergen Reactions  . Latex Dermatitis    Prior to Admission medications   Medication Sig Start Date End Date Taking? Authorizing Provider  acetaminophen (TYLENOL) 325 MG tablet Take 2 tablets (650 mg total) by mouth every 4 (four) hours as needed for headache or mild pain. 01/02/16  Yes Ramonita LabAruna Gouru, MD  apixaban (ELIQUIS) 5 MG TABS tablet Take 5 mg by mouth  2 (two) times daily.   Yes Historical Provider, MD  carvedilol (COREG) 12.5 MG tablet Take 1 tablet (12.5 mg total) by mouth 2 (two) times daily. 03/17/16  Yes Delma Freeze, FNP  diphenhydrAMINE (BENADRYL) 25 mg capsule Take 1 capsule (25 mg total) by mouth at bedtime as needed for sleep. 01/02/16  Yes Ramonita Lab, MD  meloxicam (MOBIC) 15 MG tablet Take 15 mg by mouth daily as needed for pain.   Yes Historical Provider, MD   methocarbamol (ROBAXIN) 750 MG tablet Take 750 mg by mouth daily.   Yes Historical Provider, MD  PROVENTIL HFA 108 [phone] Base) MCG/ACT inhaler Reported on 01/15/2016 12/04/15  Yes Historical Provider, MD  sacubitril-valsartan (ENTRESTO) 24-26 MG Take 1 tablet by mouth 2 (two) times daily. 02/18/16  Yes Delma Freeze, FNP  torsemide (DEMADEX) 20 MG tablet Take 0.5 tablets (10 mg total) by mouth daily as needed. 01/21/16  Yes Delma Freeze, FNP  venlafaxine XR (EFFEXOR-XR) 150 MG 24 hr capsule Take 1 capsule (150 mg total) by mouth daily with breakfast. 01/16/16  Yes Margaretann Loveless, PA-C      Review of Systems  Constitutional: Positive for fatigue (on work days). Negative for fever and appetite change.  HENT: Negative for congestion, postnasal drip and sore throat.   Eyes: Negative.   Respiratory: Negative for chest tightness.   Cardiovascular: Negative for chest pain, palpitations and leg swelling.  Gastrointestinal: Negative for abdominal pain and abdominal distention.  Endocrine: Negative.   Genitourinary: Negative for dysuria and pelvic pain.  Musculoskeletal: Positive for back pain (tightness across lower back and around the side). Negative for neck pain.  Skin: Negative.   Allergic/Immunologic: Negative.   Neurological: Positive for dizziness (few days ago). Negative for weakness, light-headedness, numbness, headaches and paresthesias.  Hematological: Negative for adenopathy. Does not bruise/bleed easily.  Psychiatric/Behavioral: Negative for sleep disturbance (sleeping on 1 pillow) and dysphoric mood. The patient is not nervous/anxious.        Objective:   Physical Exam  Constitutional: She is oriented to person, place, and time. She appears well-developed and well-nourished.  HENT:  Head: Normocephalic and atraumatic.  Eyes: Conjunctivae are normal. Pupils are equal, round, and reactive to light.  Neck: Normal range of motion. Neck supple.  Cardiovascular: Normal rate and  regular rhythm.   Pulmonary/Chest: Effort normal. She has no wheezes. She has no rales.  Abdominal: Soft. She exhibits no distension. There is no tenderness.  Musculoskeletal: She exhibits no edema or tenderness.  Neurological: She is alert and oriented to person, place, and time.  Skin: Skin is warm and dry.  Psychiatric: She has a normal mood and affect. Her behavior is normal. Thought content normal.  Nursing note and vitals reviewed.   BP 106/53 mmHg  Pulse 66  Resp 18  Ht  (1.753 m)  Wt 169 lb (76.658 kg)  BMI 24.95 kg/m2  SpO2 100%       Assessment & Plan:  1: Chronic heart failure with reduced ejection fraction- Patient presents with fatigue which she says only occurs when she's at work. When she's off work, she says that she doesn't feel tired at all. Denies any shortness of breath, weight gain or swelling in her legs/abdomen. She continues to weigh herself and says that her weight has been stable. By our scale, she's gained 1 pound since she was last here. Reminded to call for an overnight weight gain of >2 pounds or a weekly weight gain of >5  pounds. She is not adding any salt to her food and is trying to eat low salt foods but admits that at work (she works evenings), she doesn't eat as well. Carvedilol increased at her last office visit. Not interested in increasing entresto at this time. 2: Fibromyalgia- She does endorse worsening fatigue but then says that she only notices it on work days. Normal days off and vacation, she doesn't notice being tired at all. She says that she may look into seeing if there's anything else at her work that she can do that isn't quite so physical. 3: Chronic DVT of right upper extremity- She remains on eliquis at this time and is hoping to get off of this in the next few months. 4: Bilateral low back pain- She describes this pain as originating in her lower back and then it radiates around both sides of her abdomen into her abdomen. She says  that, again, she only notices it when she's at work but now it's becoming stiff even when she's not at work. Does have a history of some degenerative disc disease. She was encouraged to call her PCP to schedule an appointment for an evaluation to find out if her pain is muscular or skeletal. Patient says that she will call.  Patient did not bring her medications nor a list. Each medication was verbally reviewed with the patient and she was encouraged to bring the bottles to every visit to confirm accuracy of list.  Return in 3 months or sooner for any questions/problems before then.

## 2016-04-16 NOTE — Patient Instructions (Signed)
Continue weighing daily and call for an overnight weight gain of > 2 pounds or a weekly weight gain of >5 pounds. 

## 2016-05-12 ENCOUNTER — Inpatient Hospital Stay: Payer: BLUE CROSS/BLUE SHIELD

## 2016-05-12 ENCOUNTER — Inpatient Hospital Stay: Payer: BLUE CROSS/BLUE SHIELD | Attending: Oncology

## 2016-05-12 ENCOUNTER — Inpatient Hospital Stay: Payer: BLUE CROSS/BLUE SHIELD | Admitting: Oncology

## 2016-05-12 ENCOUNTER — Inpatient Hospital Stay (HOSPITAL_BASED_OUTPATIENT_CLINIC_OR_DEPARTMENT_OTHER): Payer: BLUE CROSS/BLUE SHIELD | Admitting: Oncology

## 2016-05-12 VITALS — BP 97/57 | HR 66 | Temp 97.1°F | Ht 69.0 in | Wt 169.1 lb

## 2016-05-12 DIAGNOSIS — Z87891 Personal history of nicotine dependence: Secondary | ICD-10-CM | POA: Diagnosis not present

## 2016-05-12 DIAGNOSIS — Z801 Family history of malignant neoplasm of trachea, bronchus and lung: Secondary | ICD-10-CM | POA: Diagnosis not present

## 2016-05-12 DIAGNOSIS — F419 Anxiety disorder, unspecified: Secondary | ICD-10-CM | POA: Diagnosis not present

## 2016-05-12 DIAGNOSIS — I509 Heart failure, unspecified: Secondary | ICD-10-CM | POA: Insufficient documentation

## 2016-05-12 DIAGNOSIS — F329 Major depressive disorder, single episode, unspecified: Secondary | ICD-10-CM

## 2016-05-12 DIAGNOSIS — Z86711 Personal history of pulmonary embolism: Secondary | ICD-10-CM

## 2016-05-12 DIAGNOSIS — Z809 Family history of malignant neoplasm, unspecified: Secondary | ICD-10-CM

## 2016-05-12 DIAGNOSIS — Z86718 Personal history of other venous thrombosis and embolism: Secondary | ICD-10-CM | POA: Diagnosis not present

## 2016-05-12 DIAGNOSIS — M797 Fibromyalgia: Secondary | ICD-10-CM | POA: Diagnosis not present

## 2016-05-12 DIAGNOSIS — Z79899 Other long term (current) drug therapy: Secondary | ICD-10-CM

## 2016-05-12 DIAGNOSIS — M5030 Other cervical disc degeneration, unspecified cervical region: Secondary | ICD-10-CM

## 2016-05-12 DIAGNOSIS — Z7901 Long term (current) use of anticoagulants: Secondary | ICD-10-CM | POA: Insufficient documentation

## 2016-05-12 DIAGNOSIS — I2699 Other pulmonary embolism without acute cor pulmonale: Secondary | ICD-10-CM

## 2016-05-12 NOTE — Progress Notes (Signed)
[facility]  Telephone:(336) [phone] Fax:(336) [phone]  ID: Maureen Grant OB: 05-23-1956  MR#: [id number]  AOZ#:[id number]#:[id number]  Patient Care Team: Margaretann LovelessJennifer M Burnette, PA-C as PCP - General (Family Medicine) Delma Freezeina A Hackney, FNP as Nurse Practitioner (Family Medicine) Dalia HeadingKenneth A Fath, MD as Consulting Physician (Cardiology) Jeralyn Ruthsimothy J Finnegan, MD as Consulting Physician (Oncology)  CHIEF COMPLAINT:  Chief Complaint  Patient presents with  . pulmonary embolism  . Follow-up    INTERVAL HISTORY: Patient returns to clinic today to be evaluated and discuss whether to continue Eliquis. She has not taken Eliquis fo the past 2 weeks because she ran out and does not have the money to purchase more. She does wish to discontinue Eliquis. At this time it has been 6 months since initiating therapy. She feels well and is asymptomatic. She has no neurologic complaints. She denies any recent fevers. She denies any further shortness of breath or chest pain. She has no nausea, vomiting, consultation, or diarrhea. She has no easy bleeding or bruising. She has no urinary complaints. Patient offers no specific complaints today.   REVIEW OF SYSTEMS:   Review of Systems  Constitutional: Negative for fever, weight loss and malaise/fatigue.  Respiratory: Negative.  Negative for cough.   Cardiovascular: Negative.  Negative for chest pain.  Gastrointestinal: Negative.  Negative for abdominal pain, blood in stool and melena.  Musculoskeletal: Negative.   Neurological: Negative.  Negative for weakness.  Endo/Heme/Allergies: Does not bruise/bleed easily.    As per HPI. Otherwise, a complete review of systems is negatve.  PAST MEDICAL HISTORY: Past Medical History  Diagnosis Date  . Depression   . DDD (degenerative disc disease), cervical   . Anxiety   . Fibromyalgia   . CHF (congestive heart failure) (HCC)     PAST SURGICAL HISTORY: Past Surgical History  Procedure Laterality Date  .  Hx of rotator cuff surgery Right 10/07/2001  . Cardiac catheterization N/A 12/31/2015    Procedure: Right/Left Heart Cath and Coronary Angiography;  Surgeon: Lamar BlinksBruce J Kowalski, MD;  Location: [facility] INVASIVE CV LAB;  Service: Cardiovascular;  Laterality: N/A;    FAMILY HISTORY Family History  Problem Relation Age of Onset  . Arthritis Mother   . Heart disease Mother   . Clotting disorder Mother   . Depression Sister   . Cancer Maternal Grandmother   . Cancer Maternal Grandfather     Lung  . Alcohol abuse Maternal Grandfather   . Diabetes Paternal Grandmother   . Dementia Paternal Grandmother   . Cancer Sister   . Thyroid disease Sister   . Heart disease Father   . CVA Father   . Alcohol abuse Father        ADVANCED DIRECTIVES:    HEALTH MAINTENANCE: Social History  Substance Use Topics  . Smoking status: Former Smoker -- 0.50 packs/day for 45 years    Types: E-cigarettes, Cigarettes    Quit date: 12/02/2015  . Smokeless tobacco: Never Used  . Alcohol Use: No    Allergies  Allergen Reactions  . Latex Dermatitis    Current Outpatient Prescriptions  Medication Sig Dispense Refill  . acetaminophen (TYLENOL) 325 MG tablet Take 2 tablets (650 mg total) by mouth every 4 (four) hours as needed for headache or mild pain.    Marland Kitchen. apixaban (ELIQUIS) 5 MG TABS tablet Take 5 mg by mouth 2 (two) times daily.    . carvedilol (COREG) 12.5 MG tablet Take 1 tablet (12.5 mg total) by mouth 2 (two)  times daily. 180 tablet 3  . diphenhydrAMINE (BENADRYL) 25 mg capsule Take 1 capsule (25 mg total) by mouth at bedtime as needed for sleep. 30 capsule 0  . meloxicam (MOBIC) 15 MG tablet Take 15 mg by mouth daily as needed for pain.    . methocarbamol (ROBAXIN) 750 MG tablet Take 750 mg by mouth daily.    Marland Kitchen PROVENTIL HFA 108 (90 Base) MCG/ACT inhaler Reported on 01/15/2016  12  . sacubitril-valsartan (ENTRESTO) 24-26 MG Take 1 tablet by mouth 2 (two) times daily. 120 tablet 3  . torsemide  (DEMADEX) 20 MG tablet Take 0.5 tablets (10 mg total) by mouth daily as needed. 30 tablet 3  . venlafaxine XR (EFFEXOR-XR) 150 MG 24 hr capsule Take 1 capsule (150 mg total) by mouth daily with breakfast. 30 capsule 11   No current facility-administered medications for this visit.    OBJECTIVE: Filed Vitals:   05/12/16 1443  BP: 97/57  Pulse: 66  Temp: 97.1 F (36.2 C)     Body mass index is 24.96 kg/(m^2).    ECOG FS:0 - Asymptomatic  General: Well-developed, well-nourished, no acute distress. Eyes: Pink conjunctiva, anicteric sclera. Lungs: Clear to auscultation bilaterally. Heart: Regular rate and rhythm. No rubs, murmurs, or gallops. Abdomen: Soft, nontender, nondistended. No organomegaly noted, normoactive bowel sounds. Musculoskeletal: No edema, cyanosis, or clubbing. Neuro: Alert, answering all questions appropriately. Cranial nerves grossly intact. Skin: No rashes or petechiae noted. Psych: Normal affect.   LAB RESULTS:  Lab Results  Component Value Date   NA 138 02/12/2016   K 3.7 02/12/2016   CL 106 02/12/2016   CO2 29 02/12/2016   GLUCOSE 87 02/12/2016   BUN 16 02/12/2016   CREATININE 0.75 02/12/2016   CALCIUM 9.0 02/12/2016   PROT 7.0 05/02/2014   ALBUMIN 3.5 05/02/2014   AST 16 05/02/2014   ALT 18 05/02/2014   ALKPHOS 94 05/02/2014   BILITOT 0.4 05/02/2014   GFRNONAA >60 02/12/2016   GFRAA >60 02/12/2016    Lab Results  Component Value Date   WBC 8.4 01/02/2016   NEUTROABS 10.6* 12/27/2015   HGB 12.9 01/02/2016   HCT 39.8 01/02/2016   MCV 85.4 01/02/2016   PLT 399 01/02/2016     STUDIES: No results found.  ASSESSMENT: Right internal jugular and subclavian DVT, pulmonary embolus. Diagnosed in January 2017.  PLAN:    1. DVT/PE: Although patient has a family history of factor V Leiden, her entire hypercoagulable workup including factor V 5 Leiden is negative. Unclear etiology of patient's DVT and PE. She does not appear to have any transient  risk factors. Discontinue Eliquis at this time. Patient completed 6 months of therapy. No follow up needed. Patient was instructed of risk factors and signs and symptoms of blood clots.   Approximately 15 minutes was spent in discussion of which 50% was consultation.  Patient expressed understanding and was in agreement with this plan. She also understands that She can call clinic at any time with any questions, concerns, or complaints.   Dr. Orlie Dakin was available for consultation and review of plan of care for this patient.  Genevie Cheshire, NP   05/12/2016 2:59 PM

## 2016-05-12 NOTE — Progress Notes (Signed)
Patient here for follow up no concerns today. 

## 2016-05-13 ENCOUNTER — Encounter: Payer: Self-pay | Admitting: Family

## 2016-06-07 ENCOUNTER — Encounter: Payer: Self-pay | Admitting: Family

## 2016-06-28 ENCOUNTER — Other Ambulatory Visit: Payer: Self-pay | Admitting: Physician Assistant

## 2016-07-15 ENCOUNTER — Encounter: Payer: BLUE CROSS/BLUE SHIELD | Admitting: Physician Assistant

## 2016-08-06 ENCOUNTER — Ambulatory Visit: Payer: BLUE CROSS/BLUE SHIELD | Admitting: Family

## 2016-08-06 ENCOUNTER — Encounter: Payer: Self-pay | Admitting: Family

## 2016-08-15 IMAGING — NM NM PULMONARY VENT & PERF
2 series · 16 of 16 positions shown · non-contrast
Comparison: Chest x-ray dated 12/31/2015

CLINICAL DATA: Chest pain at rest.

EXAM:
NUCLEAR MEDICINE VENTILATION - PERFUSION LUNG SCAN
TECHNIQUE: Ventilation images were obtained in multiple projections using
inhaled aerosol Ic-KKm DTPA. Perfusion images were obtained in
multiple projections after intravenous injection of Ic-KKm MAA.
RADIOPHARMACEUTICALS:  32.46 millicuries of Uechnetium-QQm DTPA
aerosol inhalation and 3.92 millicuries of Uechnetium-QQm MAA IV

[Series 1000: lung ventilation · 3.90mm/px · 4 acquisitions, 8 frames shown]
[im 1/4]
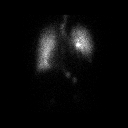
[im 1/4]
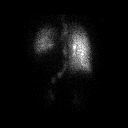
[im 2/4]
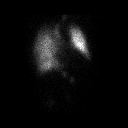
[im 2/4]
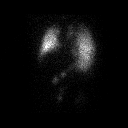
[im 3/4]
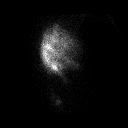
[im 3/4]
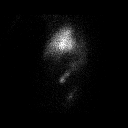
[im 4/4]
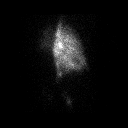
[im 4/4]
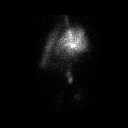

[Series 1000: lung perfusion · 1.95mm/px · 4 acquisitions, 8 frames shown]
[im 1/4]
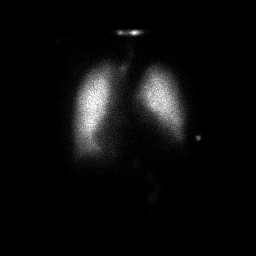
[im 1/4]
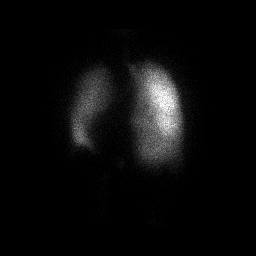
[im 2/4]
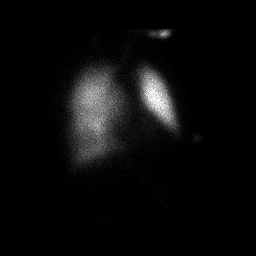
[im 2/4]
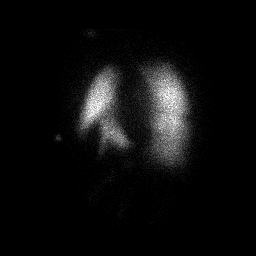
[im 3/4]
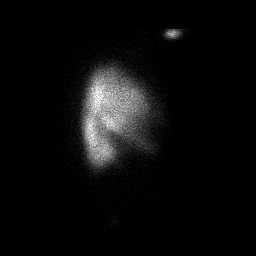
[im 3/4]
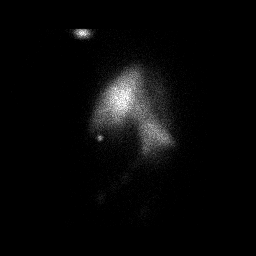
[im 4/4]
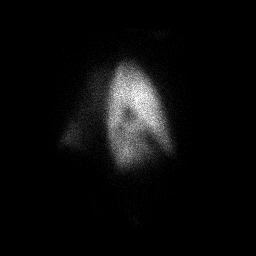
[im 4/4]
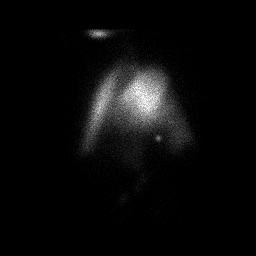

[16 of 16 positions shown; findings below may reference images not displayed]

FINDINGS: Ventilation: There is decreased perfusion at the left lung base due
to the large effusion seen on chest x-ray. The ventilation exam is
otherwise normal.

Perfusion: There is slightly decreased profusion at the left lung
base due to the effusion. There is a wedge-shaped perfusion defect
at the right lung base which is felt to be due to fluid along the
major fissure due to the small right effusion.
IMPRESSION: No findings suggestive of pulmonary embolism. Bilateral pleural
effusions, left more than right.

## 2017-01-22 ENCOUNTER — Other Ambulatory Visit: Payer: Self-pay | Admitting: Physician Assistant

## 2017-01-22 DIAGNOSIS — F329 Major depressive disorder, single episode, unspecified: Secondary | ICD-10-CM

## 2017-01-22 DIAGNOSIS — F32A Depression, unspecified: Secondary | ICD-10-CM

## 2017-03-31 ENCOUNTER — Encounter: Payer: Self-pay | Admitting: Physician Assistant

## 2017-04-01 MED ORDER — VENLAFAXINE HCL ER 150 MG PO CP24: 150.0000 mg | ORAL_CAPSULE | Freq: Every day | ORAL | 0 refills | Status: DC

## 2018-05-07 ENCOUNTER — Telehealth: Payer: Self-pay | Admitting: Emergency Medicine

## 2018-05-07 NOTE — Telephone Encounter (Signed)
Pt would like to  Get her medical records as she has transferred [facility]. She would like to physically pick these up and wants to know how to go about this. Please call back pt about this thanks.

## 2023-03-18 ENCOUNTER — Ambulatory Visit (INDEPENDENT_AMBULATORY_CARE_PROVIDER_SITE_OTHER): Payer: Medicare PPO | Admitting: Nurse Practitioner

## 2023-03-18 ENCOUNTER — Encounter: Payer: Self-pay | Admitting: Nurse Practitioner

## 2023-03-18 VITALS — BP 122/78 | HR 88 | Temp 98.1°F | Resp 18 | Ht 69.0 in | Wt 209.0 lb

## 2023-03-18 DIAGNOSIS — Z86711 Personal history of pulmonary embolism: Secondary | ICD-10-CM | POA: Insufficient documentation

## 2023-03-18 DIAGNOSIS — Z1231 Encounter for screening mammogram for malignant neoplasm of breast: Secondary | ICD-10-CM

## 2023-03-18 DIAGNOSIS — M62830 Muscle spasm of back: Secondary | ICD-10-CM

## 2023-03-18 DIAGNOSIS — I5023 Acute on chronic systolic (congestive) heart failure: Secondary | ICD-10-CM | POA: Insufficient documentation

## 2023-03-18 DIAGNOSIS — I5022 Chronic systolic (congestive) heart failure: Secondary | ICD-10-CM | POA: Diagnosis not present

## 2023-03-18 DIAGNOSIS — E559 Vitamin D deficiency, unspecified: Secondary | ICD-10-CM

## 2023-03-18 DIAGNOSIS — I517 Cardiomegaly: Secondary | ICD-10-CM

## 2023-03-18 DIAGNOSIS — Z7689 Persons encountering health services in other specified circumstances: Secondary | ICD-10-CM

## 2023-03-18 DIAGNOSIS — F4321 Adjustment disorder with depressed mood: Secondary | ICD-10-CM | POA: Insufficient documentation

## 2023-03-18 DIAGNOSIS — I4891 Unspecified atrial fibrillation: Secondary | ICD-10-CM | POA: Insufficient documentation

## 2023-03-18 DIAGNOSIS — R531 Weakness: Secondary | ICD-10-CM

## 2023-03-18 DIAGNOSIS — Z720 Tobacco use: Secondary | ICD-10-CM

## 2023-03-18 DIAGNOSIS — I4811 Longstanding persistent atrial fibrillation: Secondary | ICD-10-CM | POA: Diagnosis not present

## 2023-03-18 DIAGNOSIS — Z13820 Encounter for screening for osteoporosis: Secondary | ICD-10-CM

## 2023-03-18 DIAGNOSIS — M797 Fibromyalgia: Secondary | ICD-10-CM

## 2023-03-18 DIAGNOSIS — Z7901 Long term (current) use of anticoagulants: Secondary | ICD-10-CM | POA: Insufficient documentation

## 2023-03-18 MED ORDER — DULOXETINE HCL 20 MG PO CPEP
20.0000 mg | ORAL_CAPSULE | Freq: Every day | ORAL | 0 refills | Status: DC
Start: 2023-03-18 — End: 2023-07-02

## 2023-03-18 NOTE — Assessment & Plan Note (Signed)
[facility]  [facility] Siebert.  Currently taking entresto 49-51 mg daily, metoprolol 50 mg daily, lasix 20 mg as needed. 

## 2023-03-18 NOTE — Progress Notes (Signed)
BP 122/78   Pulse 88   Temp 98.1 F (36.7 C)   Resp 18   Ht  (1.753 m)   Wt 209 lb (94.8 kg)   SpO2 96%   BMI 30.86 kg/m    Subjective:    Patient ID: Maureen Grant, female    DOB: 1956-11-21, 67 y.o.   MRN: [id number]  HPI: Maureen Grant is a 67 y.o. female  Chief Complaint  Patient presents with   Establish Care    Goes to [facility]   Establish care: gets her care at the [place].  Medical history includes listed below.    Health maintenance due for mammogram, lung cancer screening, dexa scan, colon cancer screening ( declined) , labs. She declined labs today.   CHF/cardiomegaly/atrial fibrillation: she has a cardiologist at the [place].  Dr. Marlyne Beards.  Currently taking entresto 49-51 mg daily, metoprolol 50 mg daily, lasix 20 mg as needed. She says she is doing well at this time. She denies any palpitations, shortness of breath or chest pain.   History of pe: she was on eliquis but is no longer taking it  she is doing well.   Fibromyalgia: she says that she feels tender all over.  She is not being seen for her fibromyalgia.  She says that she has taken effexor in the past. She says that it did cause her some nausea.  Will trial cymbalta and start physical therapy.   Generalized weakness/ mid back muscle spasms: she says she feels weak in her lower extremities. She says it has been an issue for years but getting worse.  She says she has discussed with cardiology.  She says that she has had mid back muscle spasms off and on for years.  place referral to physical therapy.  [facility]   Depression/anxiety:  she says that she is really stressed about not having a job and having money trouble.  She also has fibromyalgia.  Will start cymbalta and see if that helps.  Follow up in 4 weeks.     03/18/2023    1:08 PM 04/16/2016   10:59 AM 03/17/2016   10:00 AM 02/12/2016   10:02 AM 01/15/2016    9:32 AM  Depression screen PHQ 2/9  Decreased Interest 3 0 0 0 0   Down, Depressed, Hopeless 3 0 0 0 0  PHQ - 2 Score 6 0 0 0 0  Altered sleeping 3      Tired, decreased energy 3      Change in appetite 2      Feeling bad or failure about yourself  3      Trouble concentrating 1      Moving slowly or fidgety/restless 1      Suicidal thoughts 1      PHQ-9 Score 20      Difficult doing work/chores Very difficult           03/18/2023    1:09 PM  GAD 7 : Generalized Anxiety Score  Nervous, Anxious, on Edge 3  Control/stop worrying 3  Worry too much - different things 3  Trouble relaxing 1  Restless 1  Easily annoyed or irritable 1  Afraid - awful might happen 3  Total GAD 7 Score 15  Anxiety Difficulty Not difficult at all    Vitamin d deficiency: she says she was on vitamin d supplement. Generalized weakness. Recommended lab work,patient declined at this time  and [facility]   Relevant past medical, surgical, family and social history reviewed and updated as indicated. Interim medical history since our last visit reviewed. Allergies and medications reviewed and updated.  Review of Systems  Constitutional: Negative for fever or weight change. Positive for weakness Respiratory: Negative for cough and shortness of breath.   Cardiovascular: Negative for chest pain or palpitations.  Gastrointestinal: Negative for abdominal pain, no bowel changes.  Musculoskeletal: Negative for gait problem or joint swelling.  Skin: Negative for rash.  Neurological: Negative for dizziness or headache.  No other specific complaints in a complete review of systems (except as listed in HPI above).      Objective:    BP 122/78   Pulse 88   Temp 98.1 F (36.7 C)   Resp 18   Ht  (1.753 m)   Wt 209 lb (94.8 kg)   SpO2 96%   BMI 30.86 kg/m   Wt Readings from Last 3 Encounters:  03/18/23 209 lb (94.8 kg)  05/12/16 169 lb 1.6 oz (76.7 kg)  04/16/16 169 lb (76.7 kg)    Physical Exam  Constitutional: Patient appears  well-developed and well-nourished. Obese  No distress.  HEENT: head atraumatic, normocephalic, pupils equal and reactive to light, neck supple Cardiovascular: Normal rate, regular rhythm and normal heart sounds.  No murmur heard. No BLE edema. Pulmonary/Chest: Effort normal and breath sounds normal. No respiratory distress. Abdominal: Soft.  There is no tenderness. Psychiatric: Patient has a normal mood and affect. behavior is normal. Judgment and thought content normal.  Results for orders placed or performed in visit on 02/12/16  Basic metabolic panel  Result Value Ref Range   Sodium 138 135 - 145 mmol/L   Potassium 3.7 3.5 - 5.1 mmol/L   Chloride 106 101 - 111 mmol/L   CO2 29 22 - 32 mmol/L   Glucose, Bld 87 65 - 99 mg/dL   BUN 16 6 - 20 mg/dL   Creatinine, Ser 1.61 0.44 - 1.00 mg/dL   Calcium 9.0 8.9 - 09.6 mg/dL   GFR calc non Af Amer >60 >60 mL/min   GFR calc Af Amer >60 >60 mL/min   Anion gap 3 (L) 5 - 15      Assessment & Plan:   Problem List Items Addressed This Visit       Cardiovascular and Mediastinum   CHF (congestive heart failure) - Primary    she has a cardiologist at the [place].  Dr. Marlyne Beards.  Currently taking entresto 49-51 mg daily, metoprolol 50 mg daily, lasix 20 mg as needed.      Relevant Medications   sacubitril-valsartan (ENTRESTO) 49-51 MG   rosuvastatin (CRESTOR) 20 MG tablet   metoprolol succinate (TOPROL-XL) 50 MG 24 hr tablet   furosemide (LASIX) 20 MG tablet   Unspecified atrial fibrillation    she has a cardiologist at the [place].  Dr. Marlyne Beards.  Currently taking entresto 49-51 mg daily, metoprolol 50 mg daily, lasix 20 mg as needed.      Relevant Medications   sacubitril-valsartan (ENTRESTO) 49-51 MG   rosuvastatin (CRESTOR) 20 MG tablet   metoprolol succinate (TOPROL-XL) 50 MG 24 hr tablet   furosemide (LASIX) 20 MG tablet   Cardiomegaly    she has a cardiologist at the [place].  Dr. Marlyne Beards.  Currently taking entresto 49-51 mg daily, metoprolol 50  mg daily, lasix 20 mg as needed.      Relevant Medications  sacubitril-valsartan (ENTRESTO) 49-51 MG   rosuvastatin (CRESTOR) 20 MG tablet   metoprolol succinate (TOPROL-XL) 50 MG 24 hr tablet   furosemide (LASIX) 20 MG tablet     Other   Fibromyalgia (Chronic)   Relevant Medications   DULoxetine (CYMBALTA) 20 MG capsule   Other Relevant Orders   Ambulatory referral to Physical Therapy   Vitamin D deficiency   Other Visit Diagnoses     Encounter for screening mammogram for malignant neoplasm of breast       mammogram ordered   Relevant Orders   MM 3D SCREENING MAMMOGRAM BILATERAL BREAST   Encounter for screening for osteoporosis       dexa scan ordered   Relevant Orders   DG Bone Density   Generalized weakness       Relevant Orders   Ambulatory referral to Physical Therapy   Muscle spasm of back       physical therapy referral placed   Relevant Orders   Ambulatory referral to Physical Therapy   Tobacco abuse       lung cancer screening ordered   Relevant Orders   Ambulatory Referral Lung Cancer Screening [facility]   Encounter to establish care       [facility] to [facility]        Follow up plan: Return in 4 weeks (on 04/15/2023) for AWV, follow up.

## 2023-03-18 NOTE — Assessment & Plan Note (Signed)
she has a cardiologist at the [place].  Dr. Marlyne Beards.  Currently taking entresto 49-51 mg daily, metoprolol 50 mg daily, lasix 20 mg as needed.

## 2023-04-10 ENCOUNTER — Telehealth: Payer: Self-pay | Admitting: Nurse Practitioner

## 2023-04-10 NOTE — Telephone Encounter (Signed)
Left message for patient with PT number [phone] and Delford Field [phone] to call and schedule

## 2023-04-10 NOTE — Telephone Encounter (Signed)
Copied from CRM [id number]. Topic: General - Other >> Apr 10, 2023 11:05 AM Macon Large wrote: Reason for CRM: Pt stated because she had to change her phone company she has not heard from anyone about scheduling an appt for bone density, mammogram, and physical therapy.

## 2023-04-17 ENCOUNTER — Ambulatory Visit: Payer: BLUE CROSS/BLUE SHIELD | Admitting: Nurse Practitioner

## 2023-04-17 ENCOUNTER — Ambulatory Visit (INDEPENDENT_AMBULATORY_CARE_PROVIDER_SITE_OTHER): Payer: Medicare PPO

## 2023-04-17 VITALS — BP 118/74 | Ht 69.0 in | Wt 211.3 lb

## 2023-04-17 DIAGNOSIS — Z0000 Encounter for general adult medical examination without abnormal findings: Secondary | ICD-10-CM

## 2023-04-17 DIAGNOSIS — Z1211 Encounter for screening for malignant neoplasm of colon: Secondary | ICD-10-CM

## 2023-04-17 DIAGNOSIS — Z5941 Food insecurity: Secondary | ICD-10-CM

## 2023-04-17 NOTE — Patient Instructions (Signed)
Ms. Maureen Grant , Thank you for taking time to come for your Medicare Wellness Visit. I appreciate your ongoing commitment to your health goals. Please review the following plan we discussed and let me know if I can assist you in the future.   These are the goals we discussed:  Goals   None     This is a list of the screening recommended for you and due dates:  Health Maintenance  Topic Date Due   Hepatitis C Screening: USPSTF Recommendation to screen - Ages 34-79 yo.  Never done   Colon Cancer Screening  Never done   Mammogram  Never done   Screening for Lung Cancer  12/30/2016   DEXA scan (bone density measurement)  Never done   COVID-19 Vaccine (4 - 2023-24 season) 08/01/2022   Zoster (Shingles) Vaccine (1 of 2) 06/17/2023*   Flu Shot  07/02/2023   Medicare Annual Wellness Visit  04/16/2024   DTaP/Tdap/Td vaccine (4 - Td or Tdap) 03/25/2028   Pneumonia Vaccine  Completed   HPV Vaccine  Aged Out  *Topic was postponed. The date shown is not the original due date.    Advanced directives: no  Conditions/risks identified: low falls risk  Next appointment: Follow up in one year for your annual wellness visit 05/23//2025 @2pm  in person   Preventive Care 65 Years and Older, Female Preventive care refers to lifestyle choices and visits with your health care provider that can promote health and wellness. What does preventive care include? A yearly physical exam. This is also called an annual well check. Dental exams once or twice a year. Routine eye exams. Ask your health care provider how often you should have your eyes checked. Personal lifestyle choices, including: Daily care of your teeth and gums. Regular physical activity. Eating a healthy diet. Avoiding tobacco and drug use. Limiting alcohol use. Practicing safe sex. Taking low-dose aspirin every day. Taking vitamin and mineral supplements as recommended by your health care provider. What happens during an annual well  check? The services and screenings done by your health care provider during your annual well check will depend on your age, overall health, lifestyle risk factors, and family history of disease. Counseling  Your health care provider may ask you questions about your: Alcohol use. Tobacco use. Drug use. Emotional well-being. Home and relationship well-being. Sexual activity. Eating habits. History of falls. Memory and ability to understand (cognition). Work and work Astronomer. Reproductive health. Screening  You may have the following tests or measurements: Height, weight, and BMI. Blood pressure. Lipid and cholesterol levels. These may be checked every 5 years, or more frequently if you are over 21 years old. Skin check. Lung cancer screening. You may have this screening every year starting at age 94 if you have a 30-pack-year history of smoking and currently smoke or have quit within the past 15 years. Fecal occult blood test (FOBT) of the stool. You may have this test every year starting at age 27. Flexible sigmoidoscopy or colonoscopy. You may have a sigmoidoscopy every 5 years or a colonoscopy every 10 years starting at age 30. Hepatitis C blood test. Hepatitis B blood test. Sexually transmitted disease (STD) testing. Diabetes screening. This is done by checking your blood sugar (glucose) after you have not eaten for a while (fasting). You may have this done every 1-3 years. Bone density scan. This is done to screen for osteoporosis. You may have this done starting at age 41. Mammogram. This may be done every 1-2  years. Talk to your health care provider about how often you should have regular mammograms. Talk with your health care provider about your test results, treatment options, and if necessary, the need for more tests. Vaccines  Your health care provider may recommend certain vaccines, such as: Influenza vaccine. This is recommended every year. Tetanus, diphtheria, and  acellular pertussis (Tdap, Td) vaccine. You may need a Td booster every 10 years. Zoster vaccine. You may need this after age 71. Pneumococcal 13-valent conjugate (PCV13) vaccine. One dose is recommended after age 72. Pneumococcal polysaccharide (PPSV23) vaccine. One dose is recommended after age 36. Talk to your health care provider about which screenings and vaccines you need and how often you need them. This information is not intended to replace advice given to you by your health care provider. Make sure you discuss any questions you have with your health care provider. Document Released: 12/14/2015 Document Revised: 08/06/2016 Document Reviewed: 09/18/2015 Elsevier Interactive Patient Education  2017 [employer].  Fall Prevention in the Home Falls can cause injuries. They can happen to people of all ages. There are many things you can do to make your home safe and to help prevent falls. What can I do on the outside of my home? Regularly fix the edges of walkways and driveways and fix any cracks. Remove anything that might make you trip as you walk through a door, such as a raised step or threshold. Trim any bushes or trees on the path to your home. Use bright outdoor lighting. Clear any walking paths of anything that might make someone trip, such as rocks or tools. Regularly check to see if handrails are loose or broken. Make sure that both sides of any steps have handrails. Any raised decks and porches should have guardrails on the edges. Have any leaves, snow, or ice cleared regularly. Use sand or salt on walking paths during winter. Clean up any spills in your garage right away. This includes oil or grease spills. What can I do in the bathroom? Use night lights. Install grab bars by the toilet and in the tub and shower. Do not use towel bars as grab bars. Use non-skid mats or decals in the tub or shower. If you need to sit down in the shower, use a plastic, non-slip stool. Keep  the floor dry. Clean up any water that spills on the floor as soon as it happens. Remove soap buildup in the tub or shower regularly. Attach bath mats securely with double-sided non-slip rug tape. Do not have throw rugs and other things on the floor that can make you trip. What can I do in the bedroom? Use night lights. Make sure that you have a light by your bed that is easy to reach. Do not use any sheets or blankets that are too big for your bed. They should not hang down onto the floor. Have a firm chair that has side arms. You can use this for support while you get dressed. Do not have throw rugs and other things on the floor that can make you trip. What can I do in the kitchen? Clean up any spills right away. Avoid walking on wet floors. Keep items that you use a lot in easy-to-reach places. If you need to reach something above you, use a strong step stool that has a grab bar. Keep electrical cords out of the way. Do not use floor polish or wax that makes floors slippery. If you must use wax, use non-skid floor  wax. Do not have throw rugs and other things on the floor that can make you trip. What can I do with my stairs? Do not leave any items on the stairs. Make sure that there are handrails on both sides of the stairs and use them. Fix handrails that are broken or loose. Make sure that handrails are as long as the stairways. Check any carpeting to make sure that it is firmly attached to the stairs. Fix any carpet that is loose or worn. Avoid having throw rugs at the top or bottom of the stairs. If you do have throw rugs, attach them to the floor with carpet tape. Make sure that you have a light switch at the top of the stairs and the bottom of the stairs. If you do not have them, ask someone to add them for you. What else can I do to help prevent falls? Wear shoes that: Do not have high heels. Have rubber bottoms. Are comfortable and fit you well. Are closed at the toe. Do not  wear sandals. If you use a stepladder: Make sure that it is fully opened. Do not climb a closed stepladder. Make sure that both sides of the stepladder are locked into place. Ask someone to hold it for you, if possible. Clearly mark and make sure that you can see: Any grab bars or handrails. First and last steps. Where the edge of each step is. Use tools that help you move around (mobility aids) if they are needed. These include: Canes. Walkers. Scooters. Crutches. Turn on the lights when you go into a dark area. Replace any light bulbs as soon as they burn out. Set up your furniture so you have a clear path. Avoid moving your furniture around. If any of your floors are uneven, fix them. If there are any pets around you, be aware of where they are. Review your medicines with your doctor. Some medicines can make you feel dizzy. This can increase your chance of falling. Ask your doctor what other things that you can do to help prevent falls. This information is not intended to replace advice given to you by your health care provider. Make sure you discuss any questions you have with your health care provider. Document Released: 09/13/2009 Document Revised: 04/24/2016 Document Reviewed: 12/22/2014 Elsevier Interactive Patient Education  2017 [employer].

## 2023-04-17 NOTE — Progress Notes (Signed)
Subjective:   Maureen Grant is a 67 y.o. female who presents for an Initial Medicare Annual Wellness Visit.  Review of Systems    Cardiac Risk Factors include: advanced age (>37men, >13 women)    Objective:    Today's Vitals   04/17/23 1351  BP: 118/74  Weight: 211 lb 4.8 oz (95.8 kg)  Height: 5\' 9"  (1.753 Grant)   Body mass index is 31.2 kg/Grant.     04/17/2023    2:24 PM 05/12/2016    2:48 PM 04/16/2016   10:59 AM 03/17/2016   10:00 AM 02/12/2016   10:01 AM 01/15/2016    9:31 AM 01/14/2016    3:25 PM  Advanced Directives  Does Patient Have a Medical Advance Directive? No No No No No No No  Would patient like information on creating a medical advance directive?      No - patient declined information No - patient declined information    Current Medications (verified) Outpatient Encounter Medications as of 04/17/2023  Medication Sig   furosemide (LASIX) 20 MG tablet Take 20 mg by mouth daily as needed.   metoprolol succinate (TOPROL-XL) 50 MG 24 hr tablet Take 50 mg by mouth daily.   rosuvastatin (CRESTOR) 20 MG tablet Take 20 mg by mouth daily.   sacubitril-valsartan (ENTRESTO) 49-51 MG TAKE ONE-HALF TABLET BY MOUTH EVERY 12 HOURS   DULoxetine (CYMBALTA) 20 MG capsule Take 1 capsule (20 mg total) by mouth daily. (Patient not taking: Reported on 04/17/2023)   No facility-administered encounter medications on file as of 04/17/2023.    Allergies (verified) Enalapril   History: Past Medical History:  Diagnosis Date   CHF (congestive heart failure) (HCC)    Depression    Fibromyalgia    Past Surgical History:  Procedure Laterality Date   CARDIAC CATHETERIZATION N/A 12/31/2015   Procedure: Right/Left Heart Cath and Coronary Angiography;  Surgeon: Maureen Blinks, MD;  Location: [facility] INVASIVE CV LAB;  Service: Cardiovascular;  Laterality: N/A;   Hx of rotator cuff surgery Right 10/07/2001   Family History  Problem Relation Age of Onset   Arthritis Mother    Heart disease  Mother    Clotting disorder Mother    Depression Sister    Cancer Maternal Grandmother    Cancer Maternal Grandfather        Lung   Alcohol abuse Maternal Grandfather    Diabetes Paternal Grandmother    Dementia Paternal Grandmother    Cancer Sister    Thyroid disease Sister    Heart disease Father    CVA Father    Alcohol abuse Father    Social History   Socioeconomic History   Marital status: Divorced    Spouse name: Not on file   Number of children: Not on file   Years of education: Not on file   Highest education level: Not on file  Occupational History   Not on file  Tobacco Use   Smoking status: Every Day    Packs/day: 0.50    Years: 45.00    Additional pack years: 0.00    Total pack years: 22.50    Types: Cigarettes    Last attempt to quit: 12/02/2015    Years since quitting: 7.3   Smokeless tobacco: Never  Vaping Use   Vaping Use: Former  Substance and Sexual Activity   Alcohol use: No    Alcohol/week: 0.0 standard drinks of alcohol   Drug use: No   Sexual activity: Not Currently  Other  Topics Concern   Not on file  Social History Narrative   Not on file   Social Determinants of Health   Financial Resource Strain: High Risk (04/17/2023)   Overall Financial Resource Strain (CARDIA)    Difficulty of Paying Living Expenses: Very hard  Food Insecurity: Food Insecurity Present (04/17/2023)   Hunger Vital Sign    Worried About Running Out of Food in the Last Year: Often true    Ran Out of Food in the Last Year: Often true  Transportation Needs: No Transportation Needs (04/17/2023)   PRAPARE - Administrator, Civil Service (Medical): No    Lack of Transportation (Non-Medical): No  Physical Activity: Insufficiently Active (04/17/2023)   Exercise Vital Sign    Days of Exercise per Week: 4 days    Minutes of Exercise per Session: 30 min  Stress: Stress Concern Present (04/17/2023)   Maureen Grant [employer] Occupational Health - Occupational Stress  Questionnaire    Feeling of Stress : To some extent  Social Connections: Socially Isolated (04/17/2023)   Social Connection and Isolation Panel [NHANES]    Frequency of Communication with Friends and Family: Never    Frequency of Social Gatherings with Friends and Family: Never    Attends Religious Services: Never    Diplomatic Services operational officer: No    Attends Engineer, structural: Never    Marital Status: Divorced    Tobacco Counseling Ready to quit: Not Answered Counseling given: Not Answered   Clinical Intake:  Pre-visit preparation completed: Yes  Pain : No/denies pain    BMI - recorded: 31.2 Nutritional Status: BMI > 30  Obese Nutritional Risks: None Diabetes: No  How often do you need to have someone help you when you read instructions, pamphlets, or other written materials from your doctor or pharmacy?: 1 - Never  Diabetic?no  Interpreter Needed?: No  Comments: lives alone Information entered by :: Maureen Halleck,LPN   Activities of Daily Living    04/17/2023    2:25 PM 03/18/2023    1:08 PM  In your present state of health, do you have any difficulty performing the following activities:  Hearing? 0 0  Vision? 1 1  Difficulty concentrating or making decisions? 0 0  Walking or climbing stairs? 1 1  Dressing or bathing? 0 0  Doing errands, shopping? 0 0  Preparing Food and eating ? N   Using the Toilet? N   In the past six months, have you accidently leaked urine? N   Do you have problems with loss of bowel control? N   Managing your Medications? N   Managing your Finances? N   Housekeeping or managing your Housekeeping? N     Patient Care Team: Maureen Salines, FNP as PCP - General (Nurse Practitioner) Maureen Freeze, FNP as Nurse Practitioner (Family Medicine) Maureen Gary Darlin Priestly, MD as Consulting Physician (Cardiology) Maureen Ruths, MD as Consulting Physician (Oncology)  Indicate any recent Medical Services you may have  received from other than [facility] providers in the past year (date may be approximate).     Assessment:   This is a routine wellness examination for Maureen Grant.  Hearing/Vision screen Hearing Screening - Comments:: Adequate hearing Vision Screening - Comments:: Adequate vision cataract surgery -pre-op 04/29/23;blurry vision Maureen Harrison [facility]  Dietary issues and exercise activities discussed: Current Exercise Habits: Home exercise routine, Type of exercise: walking;stretching;strength training/weights, Time (Minutes): 30, Frequency (Times/Week): 5, Weekly Exercise (Minutes/Week): 150, Intensity: Mild, Exercise limited  by: cardiac condition(s);orthopedic condition(s)   Goals Addressed   None    Depression Screen    04/17/2023    2:16 PM 03/18/2023    1:08 PM 04/16/2016   10:59 AM 03/17/2016   10:00 AM 02/12/2016   10:02 AM 01/15/2016    9:32 AM  PHQ 2/9 Scores  PHQ - 2 Score 6 6 0 0 0 0  PHQ- 9 Score 13 20        Fall Risk    04/17/2023    2:11 PM 03/18/2023    1:08 PM 04/16/2016   10:59 AM 03/17/2016   10:00 AM 02/12/2016   10:02 AM  Fall Risk   Falls in the past year? 0 0 Yes No No  Number falls in past yr: 0 0 1    Injury with Fall? 0 0 No    Risk for fall due to : No Fall Risks      Follow up Falls prevention discussed;Education provided  Falls prevention discussed      FALL RISK PREVENTION PERTAINING TO THE HOME:  Any stairs in or around the home? Yes  If so, are there any without handrails? Yes  Home free of loose throw rugs in walkways, pet beds, electrical cords, etc? Yes  Adequate lighting in your home to reduce risk of falls? Yes   ASSISTIVE DEVICES UTILIZED TO PREVENT FALLS:  Life alert? No  Use of a cane, walker or w/c? No  Grab bars in the bathroom? Yes  Shower chair or bench in shower? No  Elevated toilet seat or a handicapped toilet? Yes   TIMED UP AND GO:  Was the test performed? Yes .  Length of time to ambulate 10 feet: 8 sec.   Gait steady  and fast without use of assistive device  Cognitive Function:        04/17/2023    2:30 PM  6CIT Screen  What Year? 0 points  What month? 0 points  What time? 0 points  Count back from 20 0 points  Months in reverse 0 points  Repeat phrase 2 points  Total Score 2 points    Immunizations Immunization History  Administered Date(s) Administered   Influenza Split 10/05/2002   Influenza,inj,Quad PF,6+ Mos 01/01/2016, 03/25/2018, 12/17/2018   Influenza-Unspecified 10/16/2022   Moderna Sars-Covid-2 Vaccination 04/14/2020, 05/15/2020, 06/14/2020   PNEUMOCOCCAL CONJUGATE-20 08/13/2021   Pneumococcal Polysaccharide-23 03/25/2018   Td 10/05/2002   Tdap 08/08/2011, 03/25/2018    TDAP status: Up to date  Flu Vaccine status: Up to date  Pneumococcal vaccine status: Up to date  Covid-19 vaccine status: Completed vaccines  Qualifies for Shingles Vaccine? Yes   Zostavax completed No   Shingrix Completed?: No.    Education has been provided regarding the importance of this vaccine. Patient has been advised to call insurance company to determine out of pocket expense if they have not yet received this vaccine. Advised may also receive vaccine at local pharmacy or Health Dept. Verbalized acceptance and understanding.  Screening Tests Health Maintenance  Topic Date Due   Hepatitis C Screening  Never done   COLONOSCOPY (Pts 45-59yrs Insurance coverage will need to be confirmed)  Never done   MAMMOGRAM  Never done   Lung Cancer Screening  12/30/2016   DEXA SCAN  Never done   COVID-19 Vaccine (4 - 2023-24 season) 08/01/2022   Zoster Vaccines- Shingrix (1 of 2) 06/17/2023 (Originally 01/17/2007)   INFLUENZA VACCINE  07/02/2023   Medicare Annual Wellness (AWV)  04/16/2024  DTaP/Tdap/Td (4 - Td or Tdap) 03/25/2028   Pneumonia Vaccine 87+ Years old  Completed   HPV VACCINES  Aged Out    Health Maintenance  Health Maintenance Due  Topic Date Due   Hepatitis C Screening  Never done    COLONOSCOPY (Pts 45-46yrs Insurance coverage will need to be confirmed)  Never done   MAMMOGRAM  Never done   Lung Cancer Screening  12/30/2016   DEXA SCAN  Never done   COVID-19 Vaccine (4 - 2023-24 season) 08/01/2022    Colorectal cancer screening: Type of screening: Colonoscopy. Completed yes. [facility] every 5 [id number] [facility]  Mammogram status: Completed no. Repeat every year ordered  Bone Density-ordered.to be scheduled Tobacco Use: High Risk (04/17/2023)   Patient History    Smoking Tobacco Use: Every Day    Smokeless Tobacco Use: Never    Passive Exposure: Not on file    Lung Cancer Screening: (Low Dose CT Chest recommended if Age 57-80 years, 30 pack-year currently smoking OR have quit w/in 15years.) does qualify.   Lung Cancer Screening Referral: yes  Additional Screening:  Hepatitis C Screening: does not qualify; Completed yes  Vision Screening: Recommended annual ophthalmology exams for early detection of glaucoma and other disorders of the eye. Is the patient up to date with their annual eye exam?  Yes  Who is the provider or what is the name of the office in which the patient attends annual eye exams? Maureen Rolley Sims If pt is not established with a provider, would they like to be referred to a provider to establish care? No .   Dental Screening: Recommended annual dental exams for proper oral hygiene  Community Resource Referral / Chronic Care Management: CRR required this visit?  No   CCM required this visit?  No     Plan:     I have personally reviewed and noted the following in the patient's chart:   Medical and social history Use of alcohol, tobacco or illicit drugs  Current medications and supplements including opioid prescriptions. Patient is not currently taking opioid prescriptions. Functional ability and status Nutritional status Physical activity Advanced directives List of other physicians Hospitalizations, surgeries, and ER visits in previous 12  months Vitals Screenings to include cognitive, depression, and falls Referrals and appointments  In addition, I have reviewed and discussed with patient certain preventive protocols, quality metrics, and best practice recommendations. A written personalized care plan for preventive services as well as general preventive health recommendations were provided to patient.    Sue Lush, LPN   1/61/0960   Nurse Notes: pt expressed her frustrations of lack of money to care for herself and household. She indicates her SSI covers light,gas,car payment and only has enough left for one week worth of food. She relays she has been unsuccessful at finding at job and now cannot work as her vision is blurry. She states needs to have surgery on her eyes and is scheduled for pre-op appt at [facility] on 04/29/23. She relays she has trouble paying co-pays for visits. Pt relays she gets $23 monthly of food stamps which does not go far. Pt relays she sells her plasma as often as she can to get money for food. Pt says she is not taking the Cymbalta because she cannot afford the co-pay. She relays she is taking Effexor the maximum dose but relays it makes her nauseated. She declines to go to therapy as she indicates all her troubles are situational and related to  not having enough money. Pt owns her home (her mother left to her) and inquired if a roommate would help with food insecurity. Pt indicates she has her cats in one room and dogs in the other and cannot accommodate another person.  *referral to Psychiatry and pulmonology (lung ca screening) *pt to schedule MMG and DEXA Scan

## 2023-05-07 ENCOUNTER — Ambulatory Visit: Admission: RE | Admit: 2023-05-07 | Payer: Medicare PPO | Source: Home / Self Care | Admitting: Ophthalmology

## 2023-05-07 ENCOUNTER — Encounter: Admission: RE | Payer: Self-pay | Source: Home / Self Care

## 2023-05-07 SURGERY — PHACOEMULSIFICATION, CATARACT, WITH IOL INSERTION
Anesthesia: Topical | Laterality: Left

## 2023-05-21 ENCOUNTER — Ambulatory Visit: Admit: 2023-05-21 | Payer: Medicaid Other | Admitting: Ophthalmology

## 2023-05-21 SURGERY — PHACOEMULSIFICATION, CATARACT, WITH IOL INSERTION
Anesthesia: Topical | Laterality: Right

## 2023-06-23 ENCOUNTER — Other Ambulatory Visit: Payer: Medicare PPO

## 2023-07-02 ENCOUNTER — Ambulatory Visit (INDEPENDENT_AMBULATORY_CARE_PROVIDER_SITE_OTHER): Payer: Medicare PPO | Admitting: Nurse Practitioner

## 2023-07-02 ENCOUNTER — Other Ambulatory Visit: Payer: Self-pay

## 2023-07-02 ENCOUNTER — Encounter: Payer: Self-pay | Admitting: Nurse Practitioner

## 2023-07-02 VITALS — BP 130/86 | HR 76 | Temp 98.3°F | Resp 16 | Ht 69.0 in | Wt 206.7 lb

## 2023-07-02 DIAGNOSIS — M797 Fibromyalgia: Secondary | ICD-10-CM

## 2023-07-02 DIAGNOSIS — Z131 Encounter for screening for diabetes mellitus: Secondary | ICD-10-CM

## 2023-07-02 DIAGNOSIS — Z1159 Encounter for screening for other viral diseases: Secondary | ICD-10-CM

## 2023-07-02 DIAGNOSIS — G894 Chronic pain syndrome: Secondary | ICD-10-CM

## 2023-07-02 DIAGNOSIS — E559 Vitamin D deficiency, unspecified: Secondary | ICD-10-CM | POA: Diagnosis not present

## 2023-07-02 DIAGNOSIS — Z13220 Encounter for screening for lipoid disorders: Secondary | ICD-10-CM

## 2023-07-02 DIAGNOSIS — Z130 Encounter for screening for diseases of the blood and blood-forming organs and certain disorders involving the immune mechanism: Secondary | ICD-10-CM | POA: Diagnosis not present

## 2023-07-02 DIAGNOSIS — Z114 Encounter for screening for human immunodeficiency virus [HIV]: Secondary | ICD-10-CM | POA: Diagnosis not present

## 2023-07-02 DIAGNOSIS — M2550 Pain in unspecified joint: Secondary | ICD-10-CM

## 2023-07-02 DIAGNOSIS — E785 Hyperlipidemia, unspecified: Secondary | ICD-10-CM | POA: Diagnosis not present

## 2023-07-02 DIAGNOSIS — E612 Magnesium deficiency: Secondary | ICD-10-CM | POA: Diagnosis not present

## 2023-07-02 NOTE — Assessment & Plan Note (Signed)
Getting labs 

## 2023-07-02 NOTE — Progress Notes (Signed)
BP 130/86   Pulse 76   Temp 98.3 F (36.8 C) (Oral)   Resp 16   Ht 5\' 9"  (1.753 m)   Wt 206 lb 11.2 oz (93.8 kg)   SpO2 97%   BMI 30.52 kg/m    Subjective:    Patient ID: Maureen Grant, female    DOB: July 04, 1956, 67 y.o.   MRN: [id number]  HPI: Maureen Grant is a 67 y.o. female  Chief Complaint  Patient presents with   Fatigue   Pain    All over body pain   Last saw patient on 03/18/2023.  She reported that she gets most of her care at the [facility].  She declined labs at last visit. She was c/o pain from fibromyalgia she was given a prescription for cymbalta but did not pick it up. She also complained of lower extremity weakness and back pain that has been going on for several years.  She declined labs at that time.  We did place a referral for physical therapy. According to referral note,  they could not get a hold of patient to start treatment. She did not do the physical therapy,  she did not have the money.     Fatigue/generalized pain all over/rash: patient reports started a new job the beginning of July.  She says that she has had increase fatigue and pain all over. She says that when she left the first day she was having trouble walking she says her legs were burning.  She says that her legs were locked up.  She says that she went home and took a shower.  She says that resting did not help. She says that she woke up she was still stiff and feeling pain all over. She denies any trauma.  She does have a history of fibromyalgia. She says that although she was able to tolerate the pain before July but now that it has gotten worse.   She says that she did end up having to quit her job because she could not do it.  She says she is also experiencing stabbing sharp shocks. She says that she has also noticed some rashes that are just popping up.  She currently has a patch on her right lower leg.  She has also had it on her torso.  Will get labs, she is going to also discuss with her primary at  the [facility].    Relevant past medical, surgical, family and social history reviewed and updated as indicated. Interim medical history since our last visit reviewed. Allergies and medications reviewed and updated.  Review of Systems Constitutional: Negative for fever or weight change.  Respiratory: Negative for cough and shortness of breath.   Cardiovascular: Negative for chest pain or palpitations.  Gastrointestinal: Negative for abdominal pain, no bowel changes.  Musculoskeletal: Negative for gait problem or joint swelling. Positive for pain all over Skin: positive for rash.  Neurological: Negative for dizziness or headache.  No other specific complaints in a complete review of systems (except as listed in HPI above).      Objective:    BP 130/86   Pulse 76   Temp 98.3 F (36.8 C) (Oral)   Resp 16   Ht 5\' 9"  (1.753 m)   Wt 206 lb 11.2 oz (93.8 kg)   SpO2 97%   BMI 30.52 kg/m   Wt Readings from Last 3 Encounters:  07/02/23 206 lb 11.2 oz (93.8 kg)  04/17/23 211 lb 4.8 oz (95.8  kg)  03/18/23 209 lb (94.8 kg)    Physical Exam  Constitutional: Patient appears well-developed and well-nourished. Obese  No distress.  HEENT: head atraumatic, normocephalic, pupils equal and reactive to light, neck supple Cardiovascular: Normal rate, regular rhythm and normal heart sounds.  No murmur heard. No BLE edema. Pulmonary/Chest: Effort normal and breath sounds normal. No respiratory distress. Abdominal: Soft.  There is no tenderness. Psychiatric: Patient has a normal mood and affect. behavior is normal. Judgment and thought content normal.  Results for orders placed or performed in visit on 02/12/16  Basic metabolic panel  Result Value Ref Range   Sodium 138 135 - 145 mmol/L   Potassium 3.7 3.5 - 5.1 mmol/L   Chloride 106 101 - 111 mmol/L   CO2 29 22 - 32 mmol/L   Glucose, Bld 87 65 - 99 mg/dL   BUN 16 6 - 20 mg/dL   Creatinine, Ser 6.57 0.44 - 1.00 mg/dL   Calcium 9.0 8.9 - 84.6 mg/dL    GFR calc non Af Amer >60 >60 mL/min   GFR calc Af Amer >60 >60 mL/min   Anion gap 3 (L) 5 - 15      Assessment & Plan:   Problem List Items Addressed This Visit       Other   Fibromyalgia - Primary (Chronic)    Getting labs      Relevant Orders   Cyclic citrul peptide antibody, IgG   ANA   Rheumatoid Factor   COMPLETE METABOLIC PANEL WITH GFR   CBC with Differential/Platelet   TSH   VITAMIN D 25 Hydroxy (Vit-D Deficiency, Fractures)   Vitamin B12   Magnesium   Other Visit Diagnoses     Screening for HIV without presence of risk factors       Relevant Orders   HIV Antibody (routine testing w rflx)   Encounter for hepatitis C screening test for low risk patient       Relevant Orders   Hepatitis C antibody   Screening for diabetes mellitus       Relevant Orders   COMPLETE METABOLIC PANEL WITH GFR   Hemoglobin A1c   Screening for deficiency anemia       Relevant Orders   CBC with Differential/Platelet   Screening for cholesterol level       Relevant Orders   Lipid panel   Chronic pain syndrome       getting labs   Relevant Orders   Cyclic citrul peptide antibody, IgG   ANA   Rheumatoid Factor   COMPLETE METABOLIC PANEL WITH GFR   CBC with Differential/Platelet   TSH   VITAMIN D 25 Hydroxy (Vit-D Deficiency, Fractures)   Vitamin B12   B. burgdorfi antibodies   Magnesium   Pain in joints       getting labs   Relevant Orders   Cyclic citrul peptide antibody, IgG   ANA   Rheumatoid Factor   COMPLETE METABOLIC PANEL WITH GFR   CBC with Differential/Platelet   TSH   VITAMIN D 25 Hydroxy (Vit-D Deficiency, Fractures)   Vitamin B12   B. burgdorfi antibodies   Magnesium        Follow up plan: No follow-ups on file.

## 2023-07-06 ENCOUNTER — Other Ambulatory Visit: Payer: Self-pay | Admitting: Nurse Practitioner

## 2023-07-06 DIAGNOSIS — M2550 Pain in unspecified joint: Secondary | ICD-10-CM

## 2023-07-06 DIAGNOSIS — R768 Other specified abnormal immunological findings in serum: Secondary | ICD-10-CM

## 2023-08-18 ENCOUNTER — Other Ambulatory Visit: Payer: Medicare PPO

## 2023-12-04 NOTE — Progress Notes (Deleted)
Office Visit Note  Patient: Maureen Grant             Date of Birth: 1956-10-20           MRN: [id number]             PCP: Berniece Salines, FNP Referring: Berniece Salines, FNP Visit Date: 12/18/2023 Occupation: @GUAROCC @  Subjective:  No chief complaint on file.   History of Present Illness: Maureen Grant is a 68 y.o. female ***seen in consultation per request of her PCP for the evaluation of polyarthralgia and positive ANA.    Activities of Daily Living:  Patient reports morning stiffness for *** {minute/hour:19697}.   Patient {ACTIONS;DENIES/REPORTS:21021675::"Denies"} nocturnal pain.  Difficulty dressing/grooming: {ACTIONS;DENIES/REPORTS:21021675::"Denies"} Difficulty climbing stairs: {ACTIONS;DENIES/REPORTS:21021675::"Denies"} Difficulty getting out of chair: {ACTIONS;DENIES/REPORTS:21021675::"Denies"} Difficulty using hands for taps, buttons, cutlery, and/or writing: {ACTIONS;DENIES/REPORTS:21021675::"Denies"}  No Rheumatology ROS completed.   PMFS History:  Patient Active Problem List   Diagnosis Date Noted   Adjustment disorder with depressed mood 03/18/2023   Tobacco use 03/18/2023   Unspecified atrial fibrillation (HCC) 03/18/2023   Vitamin D deficiency 03/18/2023   History of pulmonary embolus (PE) 03/18/2023   Long term (current) use of anticoagulants 03/18/2023   Cardiomegaly 03/18/2023   Fibromyalgia 01/15/2016   Tachycardia 01/15/2016   CHF (congestive heart failure) (HCC) 12/27/2015   Anxiety 10/29/2015   Clinical depression 10/29/2015   Alcoholism in family 10/29/2015   Fibrositis 10/29/2015   History of digestive disease 10/29/2015   Encounter for long-term (current) use of non-steroidal anti-inflammatories 10/29/2015   Scoliosis of thoracic spine 10/29/2015   DDD (degenerative disc disease), cervical     Past Medical History:  Diagnosis Date   CHF (congestive heart failure) (HCC)    Depression    Fibromyalgia     Family History  Problem  Relation Age of Onset   Arthritis Mother    Heart disease Mother    Clotting disorder Mother    Depression Sister    Cancer Maternal Grandmother    Cancer Maternal Grandfather        Lung   Alcohol abuse Maternal Grandfather    Diabetes Paternal Grandmother    Dementia Paternal Grandmother    Cancer Sister    Thyroid disease Sister    Heart disease Father    CVA Father    Alcohol abuse Father    Past Surgical History:  Procedure Laterality Date   CARDIAC CATHETERIZATION N/A 12/31/2015   Procedure: Right/Left Heart Cath and Coronary Angiography;  Surgeon: Lamar Blinks, MD;  Location: [facility];  Service: Cardiovascular;  Laterality: N/A;   Hx of rotator cuff surgery Right 10/07/2001   Social History   Social History Narrative   Not on file   Immunization History  Administered Date(s) Administered   Influenza Split 10/05/2002   Influenza,inj,Quad PF,6+ Mos 01/01/2016, 03/25/2018, 12/17/2018   Influenza-Unspecified 10/16/2022   Moderna Sars-Covid-2 Vaccination 04/14/2020, 05/15/2020, 06/14/2020   PNEUMOCOCCAL CONJUGATE-20 08/13/2021   Pneumococcal Polysaccharide-23 03/25/2018   Td 10/05/2002   Tdap 08/08/2011, 03/25/2018     Objective: Vital Signs: There were no vitals taken for this visit.   Physical Exam   Musculoskeletal Exam: ***  CDAI Exam: CDAI Score: -- Patient Global: --; Provider Global: -- Swollen: --; Tender: -- Joint Exam 12/18/2023   No joint exam has been documented for this visit   There is currently no information documented on the homunculus. Go to the Rheumatology activity and complete the homunculus joint exam.  Investigation: No additional findings.  Imaging: No results found.  Recent Labs: Lab Results  Component Value Date   WBC 5.5 07/02/2023   HGB 14.1 07/02/2023   PLT 327 07/02/2023   NA 141 07/02/2023   K 4.2 07/02/2023   CL 108 07/02/2023   CO2 23 07/02/2023   GLUCOSE 119 (H) 07/02/2023   BUN 17 07/02/2023    CREATININE 0.93 07/02/2023   BILITOT 0.5 07/02/2023   ALKPHOS 94 05/02/2014   AST 19 07/02/2023   ALT 15 07/02/2023   PROT 6.2 07/02/2023   ALBUMIN 3.5 05/02/2014   CALCIUM 9.3 07/02/2023   GFRAA >60 02/12/2016    Speciality Comments: No specialty comments available.  Procedures:  No procedures performed Allergies: Enalapril   Assessment / Plan:     Visit Diagnoses: Polyarthralgia  Positive ANA (antinuclear antibody) - 07/02/23:ANA 1:320 mitotic, spindle fibers, 1:320NS, RF<10, anti-CCP<16, hepC-, HIV-, TSH 1.55, vitamin D 32, vitamin B12 627, B burgdorferi ab-, Mg 2.1  Encounter for long-term (current) use of non-steroidal anti-inflammatories  Fibromyalgia  DDD (degenerative disc disease), cervical  Cardiomegaly  Chronic systolic congestive heart failure (HCC)  Longstanding persistent atrial fibrillation (HCC)  Fibrositis  Adjustment disorder with depressed mood  Anxiety  History of digestive disease  History of pulmonary embolus (PE)  Long term (current) use of anticoagulants  Tobacco use  Vitamin D deficiency  Alcoholism in family  Orders: No orders of the defined types were placed in this encounter.  No orders of the defined types were placed in this encounter.   Face-to-face time spent with patient was *** minutes. Greater than 50% of time was spent in counseling and coordination of care.  Follow-Up Instructions: No follow-ups on file.   Gearldine Bienenstock, PA-C  Note - This record has been created using Dragon software.  Chart creation errors have been sought, but may not always  have been located. Such creation errors do not reflect on  the standard of medical care.

## 2023-12-18 ENCOUNTER — Encounter: Payer: Medicare PPO | Admitting: Rheumatology

## 2023-12-18 DIAGNOSIS — Z791 Long term (current) use of non-steroidal anti-inflammatories (NSAID): Secondary | ICD-10-CM

## 2023-12-18 DIAGNOSIS — M2550 Pain in unspecified joint: Secondary | ICD-10-CM

## 2023-12-18 DIAGNOSIS — Z6372 Alcoholism and drug addiction in family: Secondary | ICD-10-CM

## 2023-12-18 DIAGNOSIS — Z8719 Personal history of other diseases of the digestive system: Secondary | ICD-10-CM

## 2023-12-18 DIAGNOSIS — Z86711 Personal history of pulmonary embolism: Secondary | ICD-10-CM

## 2023-12-18 DIAGNOSIS — I517 Cardiomegaly: Secondary | ICD-10-CM

## 2023-12-18 DIAGNOSIS — I5022 Chronic systolic (congestive) heart failure: Secondary | ICD-10-CM

## 2023-12-18 DIAGNOSIS — M797 Fibromyalgia: Secondary | ICD-10-CM

## 2023-12-18 DIAGNOSIS — Z720 Tobacco use: Secondary | ICD-10-CM

## 2023-12-18 DIAGNOSIS — F4321 Adjustment disorder with depressed mood: Secondary | ICD-10-CM

## 2023-12-18 DIAGNOSIS — R768 Other specified abnormal immunological findings in serum: Secondary | ICD-10-CM

## 2023-12-18 DIAGNOSIS — E559 Vitamin D deficiency, unspecified: Secondary | ICD-10-CM

## 2023-12-18 DIAGNOSIS — M5030 Other cervical disc degeneration, unspecified cervical region: Secondary | ICD-10-CM

## 2023-12-18 DIAGNOSIS — I4811 Longstanding persistent atrial fibrillation: Secondary | ICD-10-CM

## 2023-12-18 DIAGNOSIS — F419 Anxiety disorder, unspecified: Secondary | ICD-10-CM

## 2023-12-18 DIAGNOSIS — Z7901 Long term (current) use of anticoagulants: Secondary | ICD-10-CM

## 2024-01-14 ENCOUNTER — Ambulatory Visit: Payer: Medicare PPO | Admitting: Rheumatology
